# Patient Record
Sex: Female | Born: 1965 | Race: Black or African American | Hispanic: No | Smoking: Never smoker
Health system: Southern US, Community
[De-identification: ages and names within clinical notes are randomized; demographics above are authoritative.]

## PROBLEM LIST (undated history)

## (undated) DIAGNOSIS — R5382 Chronic fatigue, unspecified: Secondary | ICD-10-CM

## (undated) DIAGNOSIS — M797 Fibromyalgia: Secondary | ICD-10-CM

## (undated) HISTORY — DX: Chronic fatigue, unspecified: R53.82

## (undated) HISTORY — PX: ABDOMINAL HYSTERECTOMY: SHX81

## (undated) HISTORY — PX: OTHER SURGICAL HISTORY: SHX169

## (undated) HISTORY — DX: Fibromyalgia: M79.7

---

## 1999-03-18 ENCOUNTER — Other Ambulatory Visit: Admission: RE | Admit: 1999-03-18 | Discharge: 1999-03-18 | Payer: Self-pay | Admitting: Obstetrics and Gynecology

## 2000-03-07 ENCOUNTER — Other Ambulatory Visit: Admission: RE | Admit: 2000-03-07 | Discharge: 2000-03-07 | Payer: Self-pay | Admitting: Obstetrics and Gynecology

## 2000-10-16 ENCOUNTER — Inpatient Hospital Stay (HOSPITAL_COMMUNITY): Admission: AD | Admit: 2000-10-16 | Discharge: 2000-10-16 | Payer: Self-pay | Admitting: Obstetrics and Gynecology

## 2000-11-09 ENCOUNTER — Inpatient Hospital Stay (HOSPITAL_COMMUNITY): Admission: AD | Admit: 2000-11-09 | Discharge: 2000-11-09 | Payer: Self-pay | Admitting: Obstetrics and Gynecology

## 2000-11-09 ENCOUNTER — Inpatient Hospital Stay (HOSPITAL_COMMUNITY): Admission: AD | Admit: 2000-11-09 | Discharge: 2000-11-12 | Payer: Self-pay | Admitting: Obstetrics & Gynecology

## 2000-11-10 ENCOUNTER — Encounter: Payer: Self-pay | Admitting: Obstetrics & Gynecology

## 2000-12-19 HISTORY — PX: TUBAL LIGATION: SHX77

## 2001-02-17 ENCOUNTER — Inpatient Hospital Stay (HOSPITAL_COMMUNITY): Admission: AD | Admit: 2001-02-17 | Discharge: 2001-02-19 | Payer: Self-pay | Admitting: Obstetrics & Gynecology

## 2001-03-27 ENCOUNTER — Other Ambulatory Visit: Admission: RE | Admit: 2001-03-27 | Discharge: 2001-03-27 | Payer: Self-pay | Admitting: Obstetrics & Gynecology

## 2002-04-29 ENCOUNTER — Other Ambulatory Visit: Admission: RE | Admit: 2002-04-29 | Discharge: 2002-04-29 | Payer: Self-pay | Admitting: Obstetrics and Gynecology

## 2003-05-21 ENCOUNTER — Emergency Department (HOSPITAL_COMMUNITY): Admission: EM | Admit: 2003-05-21 | Discharge: 2003-05-21 | Payer: Self-pay | Admitting: Emergency Medicine

## 2006-07-10 ENCOUNTER — Emergency Department (HOSPITAL_COMMUNITY): Admission: EM | Admit: 2006-07-10 | Discharge: 2006-07-10 | Payer: Self-pay | Admitting: Emergency Medicine

## 2008-12-19 HISTORY — PX: OTHER SURGICAL HISTORY: SHX169

## 2009-09-18 ENCOUNTER — Ambulatory Visit (HOSPITAL_COMMUNITY): Admission: RE | Admit: 2009-09-18 | Discharge: 2009-09-18 | Payer: Self-pay | Admitting: Obstetrics and Gynecology

## 2009-09-18 ENCOUNTER — Encounter (INDEPENDENT_AMBULATORY_CARE_PROVIDER_SITE_OTHER): Payer: Self-pay | Admitting: Obstetrics and Gynecology

## 2009-10-01 ENCOUNTER — Encounter: Admission: RE | Admit: 2009-10-01 | Discharge: 2009-10-01 | Payer: Self-pay | Admitting: Obstetrics and Gynecology

## 2009-12-07 ENCOUNTER — Encounter: Payer: Self-pay | Admitting: Physician Assistant

## 2009-12-15 ENCOUNTER — Ambulatory Visit (HOSPITAL_COMMUNITY): Admission: RE | Admit: 2009-12-15 | Discharge: 2009-12-15 | Payer: Self-pay | Admitting: Obstetrics and Gynecology

## 2010-01-06 ENCOUNTER — Encounter (HOSPITAL_COMMUNITY): Admission: RE | Admit: 2010-01-06 | Discharge: 2010-02-05 | Payer: Self-pay | Admitting: Preventative Medicine

## 2010-01-15 ENCOUNTER — Encounter: Admission: RE | Admit: 2010-01-15 | Discharge: 2010-01-15 | Payer: Self-pay | Admitting: Obstetrics and Gynecology

## 2010-05-19 ENCOUNTER — Ambulatory Visit: Payer: Self-pay | Admitting: Family Medicine

## 2010-05-19 DIAGNOSIS — IMO0001 Reserved for inherently not codable concepts without codable children: Secondary | ICD-10-CM

## 2010-05-19 DIAGNOSIS — E559 Vitamin D deficiency, unspecified: Secondary | ICD-10-CM | POA: Insufficient documentation

## 2010-05-19 DIAGNOSIS — R5381 Other malaise: Secondary | ICD-10-CM

## 2010-05-19 DIAGNOSIS — F341 Dysthymic disorder: Secondary | ICD-10-CM | POA: Insufficient documentation

## 2010-05-19 DIAGNOSIS — R5383 Other fatigue: Secondary | ICD-10-CM

## 2010-05-19 DIAGNOSIS — G4700 Insomnia, unspecified: Secondary | ICD-10-CM

## 2010-05-21 ENCOUNTER — Encounter: Payer: Self-pay | Admitting: Physician Assistant

## 2010-05-21 LAB — CONVERTED CEMR LAB: Retic Ct Pct: 0.9 % (ref 0.4–3.1)

## 2010-05-24 ENCOUNTER — Encounter: Payer: Self-pay | Admitting: Physician Assistant

## 2010-06-07 ENCOUNTER — Encounter (INDEPENDENT_AMBULATORY_CARE_PROVIDER_SITE_OTHER): Payer: Self-pay | Admitting: Obstetrics and Gynecology

## 2010-06-07 ENCOUNTER — Ambulatory Visit (HOSPITAL_COMMUNITY): Admission: RE | Admit: 2010-06-07 | Discharge: 2010-06-08 | Payer: Self-pay | Admitting: Obstetrics and Gynecology

## 2010-06-17 ENCOUNTER — Encounter: Payer: Self-pay | Admitting: Physician Assistant

## 2010-06-22 ENCOUNTER — Encounter: Payer: Self-pay | Admitting: Physician Assistant

## 2010-06-30 ENCOUNTER — Encounter: Payer: Self-pay | Admitting: Physician Assistant

## 2010-07-28 ENCOUNTER — Ambulatory Visit: Payer: Self-pay | Admitting: Family Medicine

## 2010-08-04 ENCOUNTER — Encounter: Payer: Self-pay | Admitting: Physician Assistant

## 2010-08-26 ENCOUNTER — Encounter: Payer: Self-pay | Admitting: Physician Assistant

## 2011-01-20 NOTE — Assessment & Plan Note (Signed)
Summary: hospital follow up- room 1   Vital Signs:  Patient profile:   45 year old female Height:      65.5 inches Weight:      155 pounds BMI:     25.49 O2 Sat:      98 % on Room air Pulse rate:   78 / minute Resp:     16 per minute BP sitting:   102 / 60  (left arm)  Vitals Entered By: Adella Hare LPN (July 28, 2010 9:47 AM) CC: follow up- stomach pain   CC:  follow up- stomach pain.  History of Present Illness: Pt presents today with c/o abd pain and bloating.  Describes the pain as crampy spasms. She had a laprascopic partial hyst in June. Was admitted the end of June due to abd pain and bloody stools.  Dx with colitis.  Treated with antibiotic.  Has seen GI outpt and has colonoscopy scheduled in Sept.  She continues to have abd pain and bloating.  Stools are formed but small and frequent.  No blood or melena seen since dischg. Some days are worse with pain than others. No nausea or vomiting.  Is only eating spinach, baked chicken, broth and clear fluids.   Pt states she is urinating normally.  No dysuria or freqency.  Pt is only taking Ambien.  Cymbalta was put on hold.  Pt states she seems to be doing well with her depression at this time without it.  She is uncertain if this is because she is so focused on her abd pain that she hasnt paid attn to it though.  Pt brought her recent hospitalization records.  Reviewed consult, H&P, Dischg summary and diagnostic studies.   Allergies (verified): 1)  ! Pcn 2)  ! Sulfa  Past History:  Past medical history reviewed for relevance to current acute and chronic problems.  Past Medical History: Reviewed history from 05/19/2010 and no changes required. Tension headaches Fibromyalgia  Review of Systems General:  Denies chills, fever, and loss of appetite. GI:  Complains of abdominal pain, change in bowel habits, and loss of appetite; denies bloody stools, constipation, dark tarry stools, diarrhea, indigestion, nausea, and  vomiting. GU:  Denies dysuria and urinary frequency. Psych:  Denies anxiety, depression, suicidal thoughts/plans, and thoughts /plans of harming others.  Physical Exam  General:  Well-developed,well-nourished,in no acute distress; alert,appropriate and cooperative throughout examination Head:  Normocephalic and atraumatic without obvious abnormalities. No apparent alopecia or balding. Lungs:  Normal respiratory effort, chest expands symmetrically. Lungs are clear to auscultation, no crackles or wheezes. Heart:  Normal rate and regular rhythm. S1 and S2 normal without gallop, murmur, click, rub or other extra sounds. Abdomen:  soft, normal bowel sounds, no masses, no hepatomegaly, and no splenomegaly.  Diffuse tenderness to palp, increased in bilat LQ.  No guarding, or rigidity. Psych:  memory intact for recent and remote, normally interactive, good eye contact, and tearful.     Impression & Recommendations:  Problem # 1:  ABDOMINAL PAIN (ICD-789.00) Assessment New Pt will continue f/u with GI. Rx Bentyl to help with abd cramping and spasms.  Problem # 2:  DEPRESSION/ANXIETY (ICD-300.4) Assessment: Comment Only Not currently taking Cymbalta since hosp dischg.  Will re-eval depression at f/u visit.  Complete Medication List: 1)  Cymbalta 30 Mg Cpep (Duloxetine hcl) .... Take 1 daily 2)  Zolpidem Tartrate 10 Mg Tabs (Zolpidem tartrate) .... Take 1 at bedtime as needed for insomnia 3)  Dicyclomine Hcl 20 Mg  Tabs (Dicyclomine hcl) .... Take 1 tab every 6 -8 hrs as needed for abd pain  Patient Instructions: 1)  Please schedule a follow-up appointment in 1 month. 2)  I have ordered a medication for you to use for your abd pain as needed. 3)  Continue with the low residue diet, and follow up with GI as planned. 4)  Continue to hold your Cymbalta at this time.  We will discuss this again at your follow up appt in one month to determine if you may need to restart  this. Prescriptions: DICYCLOMINE HCL 20 MG TABS (DICYCLOMINE HCL) take 1 tab every 6 -8 hrs as needed for abd pain  #40 x 1   Entered and Authorized by:   Esperanza Sheets PA   Signed by:   Esperanza Sheets PA on 07/28/2010   Method used:   Electronically to        [facility]  [address] (retail)       [address]       [place], [place]  [address]       Ph: [phone]       Fax: [phone]   RxID:   [id number]

## 2011-01-20 NOTE — Consult Note (Signed)
Summary: Regina Lamb   Imported By: Lind Guest 07/30/2010 08:22:13  _____________________________________________________________________  External Attachment:    Type:   Image     Comment:   External Document

## 2011-01-20 NOTE — Procedures (Signed)
Summary: Gastroenterology  Gastroenterology   Imported By: Lind Guest 08/10/2010 08:14:49  _____________________________________________________________________  External Attachment:    Type:   Image     Comment:   External Document

## 2011-01-20 NOTE — Progress Notes (Signed)
Summary: dr.[email or web address]  dr.[email or web address]   Imported By: Lind Guest 06/01/2010 12:49:28  _____________________________________________________________________  External Attachment:    Type:   Image     Comment:   External Document

## 2011-01-20 NOTE — Letter (Signed)
Summary: Discharge Summary  Discharge Summary   Imported By: Lind Guest 07/30/2010 08:21:33  _____________________________________________________________________  External Attachment:    Type:   Image     Comment:   External Document

## 2011-01-20 NOTE — Assessment & Plan Note (Signed)
Summary: new patient- room 1   Vital Signs:  Patient profile:   45 year old female Height:      65.5 inches Weight:      160.75 pounds BMI:     26.44 O2 Sat:      99 % on Room air Pulse rate:   51 / minute Resp:     16 per minute BP sitting:   100 / 64  (left arm)  Vitals Entered By: Adella Hare LPN (May 20, 2439 3:23 PM) CC: new patient Is Patient Diabetic? No Pain Assessment Patient in pain? no        CC:  new patient.  History of Present Illness: New pt here to establish care with new PCP.  Pt is here with a complicated medical hx over the last yr. To summarize, she had surgery for fibroids.  Since then has developed a calcified mass in her Lt Pelvis.  She is scheduled June 20 th for removal and hyst. She has been having extreme fatigue, weakness, and body aches since last fall. She has been to Neurologist and had full evaluation.  He told her that he thought it was fibromyalgia. she has since seen Dr Dimas Aguas for Fibromyalgia, but doesn't feel like he is the right dr to be treating her for her syptoms. Pt has been out of work for the last 4-5 weeks due to her syptoms.  Currently plans to be out until after her pelvic surgery.   Current Medications (verified): 1)  Imipramine Hcl 25 Mg Tabs (Imipramine Hcl) .... One Tab By Mouth At Bedtime 2)  Solodyn 65 Mg Xr24h-Tab (Minocycline Hcl) .... One Tab By Mouth Once Daily With Food 3)  Meclizine Hcl 12.5 Mg Tabs (Meclizine Hcl) .... One Tab By Mouth Once Daily As Needed  Allergies (verified): 1)  ! Pcn 2)  ! Sulfa  Past History:  Past medical, surgical, family and social histories (including risk factors) reviewed for relevance to current acute and chronic problems.  Past Medical History: Tension headaches Fibromyalgia  Past Surgical History: Fibroid tumors removed 2010 Laproscopy- endometriosis Tubal ligation 2002  Family History: Reviewed history and no changes required. mother living- DM, HTN,  Hyperlipidemia, hx breast cancer, rheumatoid arthritis, CHF father living- HTN, cataracts, dementia, hyperlipidemia Two sisters living- Depression, migraines, HTN Two brothers living- heart disease, obesity, arthritis, HTN, anxiety One brother deceased- days after birth  Social History: Reviewed history and no changes required. Employed full time- Engineer, mining- therapist Divorced One child  Never Smoked Alcohol use-no Drug use-no Smoking Status:  never Drug Use:  no  Review of Systems General:  Complains of chills and fatigue; denies fever. Eyes:  Complains of blurring; denies double vision. ENT:  Denies earache, nasal congestion, and sore throat. CV:  Denies chest pain or discomfort and palpitations. Resp:  Complains of shortness of breath; denies cough. GI:  Denies abdominal pain, constipation, diarrhea, indigestion, loss of appetite, nausea, and vomiting. MS:  Complains of joint pain and muscle aches. Neuro:  Complains of numbness and tingling; FINGERS AND TOES. FEELS LIKE LIGHTENING.Marland Kitchen Psych:  Complains of anxiety and depression; SEEING THERAPIST. DISTURBED SLEEP. Marland Kitchen  Physical Exam  General:  Well-developed,well-nourished,in no acute distress; alert,appropriate and cooperative throughout examination Head:  Normocephalic and atraumatic without obvious abnormalities. No apparent alopecia or balding. Eyes:  pupils equal, pupils round, and pupils reactive to light.   Ears:  External ear exam shows no significant lesions or deformities.  Otoscopic examination reveals clear canals, tympanic membranes  are intact bilaterally without bulging, retraction, inflammation or discharge. Hearing is grossly normal bilaterally. Nose:  External nasal examination shows no deformity or inflammation. Nasal mucosa are pink and moist without lesions or exudates. Mouth:  Oral mucosa and oropharynx without lesions or exudates.  Teeth in good repair. Neck:  No deformities, masses, or tenderness  noted. Lungs:  Normal respiratory effort, chest expands symmetrically. Lungs are clear to auscultation, no crackles or wheezes. Heart:  Normal rate and regular rhythm. S1 and S2 normal without gallop, murmur, click, rub or other extra sounds. Abdomen:  Bowel sounds positive,abdomen soft and non-tender without masses, organomegaly or hernias noted. Msk:  Pt reports TTP of UE, LE, and thorax.  All 4 quads of body is painful to palpation. Pulses:  R radial normal, R dorsalis pedis normal, L radial normal, and L dorsalis pedis normal.   Extremities:  No clubbing, cyanosis, edema, or deformity noted with normal full range of motion of all joints.   Neurologic:  alert & oriented X3, strength normal in all extremities, gait normal, and DTRs symmetrical and normal.   Skin:  Intact without suspicious lesions or rashes Cervical Nodes:  No lymphadenopathy noted Psych:  Cognition and judgment appear intact. Alert and cooperative with normal attention span and concentration. 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  Will f/u with GYN regarding this.  Complete Medication List: 1)  Solodyn 65 Mg Xr24h-tab (Minocycline hcl) .... One tab by mouth once daily with food 2)  Meclizine Hcl 12.5 Mg Tabs (Meclizine hcl) .... One tab by mouth once daily as needed 3)  Cymbalta 30 Mg Cpep (Duloxetine hcl) .... Take 1 daily 4)  Zolpidem Tartrate 10 Mg Tabs (Zolpidem tartrate) .... Take 1 at bedtime as needed for  insomnia  Other Orders: T-CBC No Diff ([id number]) T-TSH ([id number])  Patient Instructions: 1)  Please schedule a follow-up appointment in 2 months. 2)  I have referred you to a rheumatologist. 3)  I have ordered blood work. 4)  I have prescribed Cymbalta 30 mg to take for depression and fibromyalgia. 5)  I have prescribed Ambien to use for sleep. Prescriptions: ZOLPIDEM TARTRATE 10 MG TABS (ZOLPIDEM TARTRATE) take 1 at bedtime as needed for insomnia  #30 x 0   Entered and Authorized by:   Esperanza Sheets PA   Signed by:   Esperanza Sheets PA on 05/19/2010   Method used:   Printed then faxed to ...       [facility]  [address] (retail)       [address]       [place], [place]  [address]       Ph: [phone]       Fax: [phone]   RxID:   [id number] CYMBALTA 30 MG CPEP (DULOXETINE HCL) take 1 daily  #30 x 1   Entered and Authorized by:   Esperanza Sheets PA   Signed by:   Esperanza Sheets PA on 05/19/2010   Method used:   Electronically to        [facility]   [address] [facility] (  retail)       [address]       [place], [place]  [address]       Ph: [phone]       Fax: [phone]   RxID:   [id number]

## 2011-03-06 LAB — HCG, SERUM, QUALITATIVE: Preg, Serum: NEGATIVE

## 2011-03-06 LAB — CBC
MCHC: 33.4 g/dL (ref 30.0–36.0)
Platelets: 277 10*3/uL (ref 150–400)
RBC: 4.18 MIL/uL (ref 3.87–5.11)
RDW: 13.2 % (ref 11.5–15.5)
WBC: 3.8 10*3/uL — ABNORMAL LOW (ref 4.0–10.5)

## 2011-03-09 ENCOUNTER — Telehealth: Payer: Self-pay | Admitting: Family Medicine

## 2011-03-09 NOTE — Telephone Encounter (Signed)
Unfortunately advise no appts available at this time , will need alternate urgwent  Care or the ED since she reports being extremely sick

## 2011-03-15 ENCOUNTER — Ambulatory Visit: Payer: Self-pay

## 2011-03-24 LAB — CBC
Hemoglobin: 12 g/dL (ref 12.0–15.0)
RBC: 4.29 MIL/uL (ref 3.87–5.11)
WBC: 3.5 10*3/uL — ABNORMAL LOW (ref 4.0–10.5)

## 2011-03-24 LAB — HCG, SERUM, QUALITATIVE: Preg, Serum: NEGATIVE

## 2011-03-30 ENCOUNTER — Encounter: Payer: Self-pay | Admitting: Physician Assistant

## 2011-03-30 ENCOUNTER — Telehealth: Payer: Self-pay | Admitting: Physician Assistant

## 2011-03-30 NOTE — Telephone Encounter (Signed)
Appt scheduled for Friday. Had labs done at [facility] and her hba1c was elevated

## 2011-03-30 NOTE — Telephone Encounter (Signed)
Patient coming on Friday  4.13.12  lhb

## 2011-03-31 ENCOUNTER — Encounter: Payer: Self-pay | Admitting: Family Medicine

## 2011-03-31 ENCOUNTER — Encounter: Payer: Self-pay | Admitting: Physician Assistant

## 2011-04-04 ENCOUNTER — Encounter: Payer: Self-pay | Admitting: Family Medicine

## 2011-04-11 ENCOUNTER — Encounter: Payer: Self-pay | Admitting: Family Medicine

## 2011-04-13 ENCOUNTER — Encounter: Payer: Self-pay | Admitting: Physician Assistant

## 2011-04-14 ENCOUNTER — Encounter: Payer: Self-pay | Admitting: Family Medicine

## 2011-04-14 ENCOUNTER — Ambulatory Visit (INDEPENDENT_AMBULATORY_CARE_PROVIDER_SITE_OTHER): Payer: 59 | Admitting: Family Medicine

## 2011-04-14 ENCOUNTER — Other Ambulatory Visit: Payer: Self-pay | Admitting: Family Medicine

## 2011-04-14 VITALS — BP 110/70 | HR 74 | Resp 16 | Ht 65.0 in | Wt 156.0 lb

## 2011-04-14 DIAGNOSIS — Z139 Encounter for screening, unspecified: Secondary | ICD-10-CM

## 2011-04-14 DIAGNOSIS — E559 Vitamin D deficiency, unspecified: Secondary | ICD-10-CM

## 2011-04-14 DIAGNOSIS — G4700 Insomnia, unspecified: Secondary | ICD-10-CM

## 2011-04-14 DIAGNOSIS — R5383 Other fatigue: Secondary | ICD-10-CM

## 2011-04-14 DIAGNOSIS — Z23 Encounter for immunization: Secondary | ICD-10-CM

## 2011-04-14 DIAGNOSIS — M949 Disorder of cartilage, unspecified: Secondary | ICD-10-CM

## 2011-04-14 DIAGNOSIS — R7301 Impaired fasting glucose: Secondary | ICD-10-CM

## 2011-04-14 DIAGNOSIS — Z79899 Other long term (current) drug therapy: Secondary | ICD-10-CM

## 2011-04-14 DIAGNOSIS — F341 Dysthymic disorder: Secondary | ICD-10-CM

## 2011-04-14 NOTE — Patient Instructions (Addendum)
F/u in 3 month  CBC and anemia panel, hBA1C and fasting lipids ESR in 3 months. Pls start  Taking one multivitamin one daily Pls start  Walking 30 minutes every day.  We will provide 1200 and 1500 cal diet sheet. It is vital you eat regularly. A healthy diet is rich in fruit, vegetables and whole grains. Poultry fish, nuts and beans are a healthy choice for protein rather then red meat. A low sodium diet and drinking 64 ounces of water daily is generally recommended. Oils and sweet should be limited. Carbohydrates especially for those who are diabetic or overweight, should be limited to 34-45 gram per meal. It is important to eat on a regular schedule, at least 3 times daily. Snacks should be primarily fruits, vegetables or nuts.   You will be referred for a sleep study.  You need to change your sleep habits, commit to 7 hrs per night , max of 13hrs work per day, ok to use benadryl 1 at night for allergies and sleep

## 2011-04-14 NOTE — Progress Notes (Signed)
  Subjective:    Patient ID: Regina Lamb, female    DOB: [birth year], 45 y.o.   MRN: [id number]  HPI  C/o exhaustion , denies excessive daytime sleepiness, or snoring, but states she simply has no energy. The pt gives a h/o inadequate hours of sleep, and constantly "being on the run".Shedenies symptoms of depression but does feel stressed. She denies fever , chills , poor apetite or change in bowel movements. She was asked to be seen because of "abnormal blood results" question of elevated blood sugar and anemia She denies excessive daytime sleepiness and is not known to snore excessively  Review of Systems SeeHPI Denies recent fever or chills. Denies sinus pressure, nasal congestion, ear pain or sore throat. Denies chest congestion, productive cough or wheezing. Denies chest pains, palpitations, paroxysmal nocturnal dyspnea, orthopnea and leg swelling Denies abdominal pain, nausea, vomiting,diarrhea or constipation.  Denies rectal bleeding or change in bowel movement. Denies dysuria, frequency, hesitancy or incontinence. Denies joint pain, swelling and limitation in mobility. Denies headaches, seizure, numbness, or tingling.  Denies skin break down or rash.        Objective:   Physical Exam Patient alert and oriented and in no Cardiopulmonary distress.  HEENT: No facial asymmetry, EOMI, no sinus tenderness, TM's clear, Oropharynx pink and moist.  Neck supple no adenopathy.  Chest: Clear to auscultation bilaterally.  CVS: S1, S2 no murmurs, no S3.  ABD: Soft non tender. Bowel sounds normal.  Ext: No edema  MS: Adequate ROM spine, shoulders, hips and knees.  Skin: Intact, no ulcerations or rash noted.  Psych: Good eye contact, normal affect. Memory intact not anxious or depressed appearing.  CNS: CN 2-12 intact, power, tone and sensation normal throughout.        Assessment & Plan:

## 2011-04-18 ENCOUNTER — Encounter: Payer: Self-pay | Admitting: Family Medicine

## 2011-04-18 ENCOUNTER — Encounter: Payer: Self-pay | Admitting: Physician Assistant

## 2011-04-19 ENCOUNTER — Ambulatory Visit (HOSPITAL_COMMUNITY)
Admission: RE | Admit: 2011-04-19 | Discharge: 2011-04-19 | Disposition: A | Discharge status: Home / Self Care | Payer: 59 | Source: Ambulatory Visit | Attending: Family Medicine | Admitting: Family Medicine

## 2011-04-19 DIAGNOSIS — Z1231 Encounter for screening mammogram for malignant neoplasm of breast: Secondary | ICD-10-CM | POA: Insufficient documentation

## 2011-04-19 DIAGNOSIS — Z139 Encounter for screening, unspecified: Secondary | ICD-10-CM

## 2011-04-28 ENCOUNTER — Encounter: Payer: Self-pay | Admitting: Family Medicine

## 2011-04-28 NOTE — Assessment & Plan Note (Signed)
Deteriorated, will refer to sleep specialist

## 2011-04-28 NOTE — Assessment & Plan Note (Signed)
Labs show decreased vit D levels weekly supplement prescribed

## 2011-04-28 NOTE — Assessment & Plan Note (Signed)
Pt denies symptoms of either at this time, had been on cymbalta in the past, does not see the need. Denies suicidal or homicidal ideation

## 2011-04-28 NOTE — Assessment & Plan Note (Signed)
Requests sleep aid to improve quality and duration of sleepGood. Sleep hygiene reviewed in detail also

## 2011-05-06 NOTE — Discharge Summary (Signed)
[facility]  Patient:    Regina Lamb, Regina Lamb                 MRN: [id number] Adm. Date:  [id number] Disc. Date: [id number] Attending:  Mickle Mallory Dictator:   Leilani Able, P.A.                           Discharge Summary  FINAL DIAGNOSES:              A 27-week gestation with preterm                               contractions.  HISTORY OF PRESENT ILLNESS:   This 45 year old G1, P0 presents at around [redacted] weeks gestation with preterm contractions. Patient has been treated with subcutaneous and p.o. Terbutaline. She came back today complaining of increased pain. Her cervix was still long and closed upon admission.  HOSPITAL COURSE:              She was started on magnesium sulfate, clindamycin IV, and labs were to be obtained. Patients pain was localized to the area near the umbilicus and a 5- x 5-cm fibroid was palpated. There were no hernias or contractions noted. No evidence of any infectious etiology. Patient was continued on her magnesium sulfate at this point.  By hospital day #1 patient was doing well with just an occasional episode of the pain around where her fibroid was located. Patient was stopped on her magnesium sulfate and started on oral Terbutaline. Clindamycin was stopped at this point.  By November 25 patient was doing well without complaints. Tylox was controlling her pain. She was not having any contractions on her Terbutaline.  DISCHARGE INSTRUCTIONS:       She was sent home on a regular diet. She was told to decrease activities and told to continue her bed rest. Patient was also told to call with any increased pain, fever, or contractions.  DISCHARGE MEDICATIONS:        She was given Terbutaline 5 mg one every four hours and also some Tylox for her pain. DD:  12/11/00 TD:  12/11/00 Job: [id number]

## 2011-07-09 LAB — LIPID PANEL
Cholesterol: 211 mg/dL — ABNORMAL HIGH (ref 0–200)
HDL: 64 mg/dL (ref >39)

## 2011-07-09 LAB — CBC WITH DIFFERENTIAL/PLATELET
Basophils Absolute: 0 10*3/uL (ref 0.0–0.1)
HCT: 38.7 % (ref 36.0–46.0)
Hemoglobin: 12.3 g/dL (ref 12.0–15.0)
Lymphocytes Relative: 52 % — ABNORMAL HIGH (ref 12–46)
Lymphs Abs: 2.1 10*3/uL (ref 0.7–4.0)
Monocytes Absolute: 0.3 10*3/uL (ref 0.1–1.0)
Monocytes Relative: 7 % (ref 3–12)
Neutro Abs: 1.6 10*3/uL — ABNORMAL LOW (ref 1.7–7.7)
RBC: 4.49 MIL/uL (ref 3.87–5.11)
WBC: 4 10*3/uL (ref 4.0–10.5)

## 2011-07-09 LAB — IRON AND TIBC
TIBC: 342 ug/dL (ref 250–470)
UIBC: 300 ug/dL

## 2011-07-09 LAB — SEDIMENTATION RATE: Sed Rate: 11 mm/hr (ref 0–22)

## 2011-07-09 LAB — VITAMIN B12: Vitamin B-12: 433 pg/mL (ref 211–911)

## 2011-07-09 LAB — HEMOGLOBIN A1C
Hgb A1c MFr Bld: 5.7 % — ABNORMAL HIGH (ref <5.7)
Mean Plasma Glucose: 117 mg/dL — ABNORMAL HIGH (ref <117)

## 2011-07-12 ENCOUNTER — Encounter: Payer: Self-pay | Admitting: Physician Assistant

## 2011-07-14 ENCOUNTER — Encounter: Payer: Self-pay | Admitting: Physician Assistant

## 2011-07-14 ENCOUNTER — Ambulatory Visit: Payer: 59 | Admitting: Family Medicine

## 2011-07-29 ENCOUNTER — Encounter: Payer: Self-pay | Admitting: Physician Assistant

## 2011-08-01 ENCOUNTER — Encounter: Payer: Self-pay | Admitting: Physician Assistant

## 2011-08-02 ENCOUNTER — Encounter: Payer: Self-pay | Admitting: Family Medicine

## 2011-08-02 ENCOUNTER — Ambulatory Visit (INDEPENDENT_AMBULATORY_CARE_PROVIDER_SITE_OTHER): Payer: 59 | Admitting: Family Medicine

## 2011-08-02 VITALS — BP 100/62 | HR 87 | Resp 14 | Ht 65.0 in | Wt 152.0 lb

## 2011-08-02 DIAGNOSIS — G4700 Insomnia, unspecified: Secondary | ICD-10-CM

## 2011-08-02 DIAGNOSIS — R51 Headache: Secondary | ICD-10-CM | POA: Insufficient documentation

## 2011-08-02 DIAGNOSIS — R519 Headache, unspecified: Secondary | ICD-10-CM | POA: Insufficient documentation

## 2011-08-02 DIAGNOSIS — E559 Vitamin D deficiency, unspecified: Secondary | ICD-10-CM

## 2011-08-02 MED ORDER — IBUPROFEN 600 MG PO TABS: ORAL_TABLET | ORAL | Status: DC

## 2011-08-02 MED ORDER — BUTALBITAL-APAP-CAFFEINE 50-325-40 MG PO TABS: ORAL_TABLET | ORAL | Status: DC

## 2011-08-02 MED ORDER — TOPIRAMATE 25 MG PO TABS: 25.0000 mg | ORAL_TABLET | Freq: Every day | ORAL | Status: DC

## 2011-08-02 NOTE — Progress Notes (Signed)
  Subjective:    Patient ID: Regina Lamb, female    DOB: 1966-01-08, 45 y.o.   MRN: [id number]  HPI Pt missed her sleep study and will reschedule  She had concussion , fell in bathtub 09/2009, since  Then she started having headaches. She has seen neurologist in the past , but is having increased frequency and severity in her headaches in the past 9 months.On avg she has a headache 2 weeks out of a month. 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 Denies sinus pressure, nasal congestion, ear pain or sore throat. Denies chest congestion, productive cough or wheezing. Denies chest pains, palpitations and leg swelling Denies abdominal pain, nausea, vomiting,diarrhea or constipation.   Denies dysuria, frequency, hesitancy or incontinence. Denies joint pain, swelling and limitation in mobility.  Denies skin break down or rash.        Objective:   Physical Exam Patient alert and oriented and in no cardiopulmonary distress.  HEENT: No facial asymmetry, EOMI, no sinus tenderness,  oropharynx pink and moist.  Neck supple no adenopathy.  Chest: Clear to auscultation bilaterally.  CVS: S1, S2 no murmurs, no S3.  ABD: Soft non tender. Bowel sounds normal.  Ext: No edema  MS: Adequate ROM spine, shoulders, hips and knees.  Skin: Intact, no ulcerations or rash noted.  Psych: Good eye contact, normal affect. Memory intact not anxious or depressed appearing.  CNS: CN 2-12 intact, power, tone and sensation normal throughout.        Assessment & Plan:

## 2011-08-02 NOTE — Patient Instructions (Signed)
F/u in  Late November or early December Pls practice good sleep hygiene   Medication is sent in for daily use for migraine prevention, and use also for use when you have a bad headache.  You are being referred to Dr Gerilyn Pilgrim for headache.  Continue once daily iron, add one multivitamin once daily . Pls ensure the vitamin has at least 400 to 600IU  Of vit D   Pls reduce fried and fatty foods, cheese and oils.  It is important that you exercise regularly at least 30 minutes 5 times a week. If you develop chest pain, have severe difficulty breathing, or feel very tired, stop exercising immediately and seek medical attention    A healthy diet is rich in fruit, vegetables and whole grains. Poultry fish, nuts and beans are a healthy choice for protein rather then red meat. A low sodium diet and drinking 64 ounces of water daily is generally recommended. Oils and sweet should be limited. Carbohydrates especially for those who are diabetic or overweight, should be limited to 34-45 gram per meal. It is important to eat on a regular schedule, at least 3 times daily. Snacks should be primarily fruits, vegetables or nuts. . Goal  of 6 hrs every day in bed with no light or sound...SLEEPING

## 2011-08-03 ENCOUNTER — Emergency Department (HOSPITAL_COMMUNITY): Payer: 59

## 2011-08-03 ENCOUNTER — Telehealth: Payer: Self-pay | Admitting: Physician Assistant

## 2011-08-03 ENCOUNTER — Emergency Department (HOSPITAL_COMMUNITY)
Admission: EM | Admit: 2011-08-03 | Discharge: 2011-08-03 | Disposition: A | Discharge status: Home / Self Care | Payer: 59 | Attending: Emergency Medicine | Admitting: Emergency Medicine

## 2011-08-03 ENCOUNTER — Inpatient Hospital Stay (EMERGENCY_DEPARTMENT_HOSPITAL)
Admission: AD | Admit: 2011-08-03 | Discharge: 2011-08-03 | Disposition: A | Discharge status: Home / Self Care | Payer: 59 | Source: Ambulatory Visit | Attending: Obstetrics and Gynecology | Admitting: Obstetrics and Gynecology

## 2011-08-03 DIAGNOSIS — R209 Unspecified disturbances of skin sensation: Secondary | ICD-10-CM | POA: Insufficient documentation

## 2011-08-03 DIAGNOSIS — M25539 Pain in unspecified wrist: Secondary | ICD-10-CM | POA: Insufficient documentation

## 2011-08-03 DIAGNOSIS — W1809XA Striking against other object with subsequent fall, initial encounter: Secondary | ICD-10-CM | POA: Insufficient documentation

## 2011-08-03 DIAGNOSIS — S59909A Unspecified injury of unspecified elbow, initial encounter: Secondary | ICD-10-CM | POA: Insufficient documentation

## 2011-08-03 DIAGNOSIS — S59919A Unspecified injury of unspecified forearm, initial encounter: Secondary | ICD-10-CM

## 2011-08-03 DIAGNOSIS — W010XXA Fall on same level from slipping, tripping and stumbling without subsequent striking against object, initial encounter: Secondary | ICD-10-CM | POA: Insufficient documentation

## 2011-08-03 DIAGNOSIS — S6990XA Unspecified injury of unspecified wrist, hand and finger(s), initial encounter: Secondary | ICD-10-CM | POA: Insufficient documentation

## 2011-08-03 NOTE — Progress Notes (Signed)
Pt states she fell last night 8-14 at 2000 and hurt her L wrist. Has been having increasing pain, swelling in her L wrist radiating up into L elbow.

## 2011-08-03 NOTE — Progress Notes (Signed)
Ice pack applied in triage.

## 2011-08-03 NOTE — ED Provider Notes (Signed)
History     Chief Complaint  Patient presents with  . Wrist Pain   HPI Larey Seat at work today landing on Left Hand and arm.  Has sharp pain in hand, wrist and arm with numbness in 4th and 5th fingers.     Past Medical History  Diagnosis Date  . Vitamin D deficiency   . Fibromyalgia   . Fibromyalgia   . Chronic fatigue     Past Surgical History  Procedure Date  . Fibroid tumors removed 2010  . Laproscopy -endometriosis   . Tubal ligation 2002  . Abdominal hysterectomy     Family History  Problem Relation Age of Onset  . Diabetes Mother   . Hypertension Mother   . Hyperlipidemia Mother   . Rheum arthritis Mother   . Heart failure Mother   . Breast cancer Mother   . Cancer Mother     breast  . Dementia Father   . Cataracts Father   . Hyperlipidemia Father   . Hypertension Father   . Heart disease Brother   . Cancer Maternal Grandmother     lung  . Depression Sister     History  Substance Use Topics  . Smoking status: Never Smoker   . Smokeless tobacco: Not on file  . Alcohol Use: No    Allergies:  Allergies  Allergen Reactions  . Penicillins   . Sulfonamide Derivatives     Prescriptions prior to admission  Medication Sig Dispense Refill  . butalbital-acetaminophen-caffeine (FIORICET) 50-325-40 MG per tablet One tablet twice daily as needed for severe headache  30 tablet  0  . ferrous gluconate (FERGON) 325 MG tablet Take 325 mg by mouth daily with breakfast.        . ibuprofen (ADVIL,MOTRIN) 600 MG tablet One tablet twice daily as needed  For severe headache  40 tablet  0  . topiramate (TOPAMAX) 25 MG tablet Take 1 tablet (25 mg total) by mouth at bedtime.  30 tablet  2  . zolpidem (AMBIEN) 10 MG tablet Take 10 mg by mouth at bedtime as needed. As needed for insomnia           ROS Negative except as above  Physical Exam   Blood pressure 100/67, pulse 64, temperature 98.9 F (37.2 C), temperature source Oral, resp. rate 16, height 5' 3.5" (1.613  m), weight 152 lb 5.8 oz (69.11 kg), SpO2 99.00%.  Physical Exam Left Wrist tender. No edema or erethema. No ecchymosis. Limited ROM due to pain. Some point tenderness on lateral wrist condyl.    MAU Course  Procedures    Assessment and Plan  Explained to patient that we do not have the expertise here to adequately assess her orthopedically.  Offered to have her go to Urgent Care or ED. Wants to go to [facility] by private car.   [facility] 08/03/2011, 6:19 PM

## 2011-08-03 NOTE — Telephone Encounter (Signed)
Faxed over a note stating she was seen at 2:45 yesterday

## 2011-08-04 ENCOUNTER — Ambulatory Visit: Payer: 59 | Attending: Neurology | Admitting: Sleep Medicine

## 2011-08-04 DIAGNOSIS — G4730 Sleep apnea, unspecified: Secondary | ICD-10-CM

## 2011-08-04 DIAGNOSIS — G4710 Hypersomnia, unspecified: Secondary | ICD-10-CM | POA: Insufficient documentation

## 2011-08-05 NOTE — Procedures (Signed)
NAME:  Regina Lamb, PROBERT                ACCOUNT NO.:  [id number]  MEDICAL RECORD NO.:  [id number]          PATIENT TYPE:  OUT  LOCATION:  SLEEP LAB                     FACILITY:  [facility]  PHYSICIAN:  Azani Brogdon A. Gerilyn Pilgrim, M.D. DATE OF BIRTH:  1966-02-26  DATE OF STUDY:  08/04/2011                           NOCTURNAL POLYSOMNOGRAM  REFERRING PHYSICIAN:  Esperanza Sheets, PA-C  REFERRING PHYSICIAN:  Keagon Glascoe A. Gerilyn Pilgrim, MD  INDICATION:  A 45 year old who presents with snoring and daytime fatigue.  This study is being done to evaluate for obstructive sleep apnea syndrome.   MEDICATIONS:  Tylenol.  EPWORTH SLEEPINESS SCALE:  0..  BMI:  25.Marland Kitchen  ARCHITECTURAL SUMMARY:  The total recording time is 381 minutes.  Sleep efficiency is 88%, sleep latency 14 minutes, REM latency 90 minutes. Stage N1 1%, N2 57%, N3 24%, and REM sleep 80%.  RESPIRATORY SUMMARY:  Baseline oxygen saturation is 99, lowest saturation 94 during non-REM sleep.  Diagnostic AHI 0.  RDI is also 0.  LIMB MOVEMENT SUMMARY:  PLM index 0.  ELECTROCARDIOGRAM SUMMARY:  Average heart rate 54 with no significant dysrhythmias observed.  IMPRESSION:  Unremarkable nocturnal polysomnography.    Hassaan Crite A. Gerilyn Pilgrim, M.D.    KAD/MEDQ  D:  08/05/2011 09:18:40  T:  08/05/2011 11:08:47  Job:  [id number]

## 2011-08-15 NOTE — Assessment & Plan Note (Signed)
Uncontrolled, pt to start prophylactic topamax, and use fioricet as needed

## 2011-08-15 NOTE — Assessment & Plan Note (Signed)
Chronic problem, averages about 4 hrs sleep, hygiene discussed, changes to be made, med to be started

## 2011-08-15 NOTE — Assessment & Plan Note (Signed)
Pt to start weekly prescription vit D

## 2011-11-16 ENCOUNTER — Encounter: Payer: Self-pay | Admitting: Physician Assistant

## 2011-11-21 ENCOUNTER — Ambulatory Visit (INDEPENDENT_AMBULATORY_CARE_PROVIDER_SITE_OTHER): Payer: BC Managed Care – PPO | Admitting: Family Medicine

## 2011-11-21 ENCOUNTER — Encounter: Payer: Self-pay | Admitting: *Deleted

## 2011-11-21 ENCOUNTER — Encounter: Payer: Self-pay | Admitting: Family Medicine

## 2011-11-21 VITALS — BP 116/60 | HR 71 | Resp 16 | Ht 65.0 in | Wt 149.0 lb

## 2011-11-21 DIAGNOSIS — M549 Dorsalgia, unspecified: Secondary | ICD-10-CM

## 2011-11-21 DIAGNOSIS — R5381 Other malaise: Secondary | ICD-10-CM

## 2011-11-21 DIAGNOSIS — R5383 Other fatigue: Secondary | ICD-10-CM

## 2011-11-21 DIAGNOSIS — F341 Dysthymic disorder: Secondary | ICD-10-CM

## 2011-11-21 DIAGNOSIS — E785 Hyperlipidemia, unspecified: Secondary | ICD-10-CM

## 2011-11-21 DIAGNOSIS — R7302 Impaired glucose tolerance (oral): Secondary | ICD-10-CM | POA: Insufficient documentation

## 2011-11-21 DIAGNOSIS — R7309 Other abnormal glucose: Secondary | ICD-10-CM

## 2011-11-21 DIAGNOSIS — Z23 Encounter for immunization: Secondary | ICD-10-CM

## 2011-11-21 DIAGNOSIS — G4700 Insomnia, unspecified: Secondary | ICD-10-CM

## 2011-11-21 MED ORDER — CYCLOBENZAPRINE HCL 10 MG PO TABS: ORAL_TABLET | ORAL | Status: DC

## 2011-11-21 MED ORDER — KETOROLAC TROMETHAMINE 60 MG/2ML IM SOLN
60.0000 mg | Freq: Once | INTRAMUSCULAR | Status: AC
  Administered 2011-11-21: 60 mg via INTRAMUSCULAR

## 2011-11-21 MED ORDER — PAROXETINE HCL 10 MG PO TABS: 10.0000 mg | ORAL_TABLET | ORAL | Status: DC

## 2011-11-21 MED ORDER — INFLUENZA VAC TYPES A & B PF IM SUSP: 0.5000 mL | Freq: Once | INTRAMUSCULAR | Status: AC

## 2011-11-21 NOTE — Progress Notes (Signed)
  Subjective:    Patient ID: Regina Lamb, female    DOB: 19-Jun-1966, 45 y.o.   MRN: [id number]  HPI Pt reports that from 11/14 to 11/22 she was at home with what she thinks is strep throat, states she tried to get appt here was told to go to urgent care , and opted not to and let it "run its course" Also reports dizziness. C/o in creased back pain x 3 weeks, broke it while in the [employer] 20 years ago, no interest in additional pain medication, burt will accept toradol in the office. States her biggest problem is stress and lack of sleep . Uses melatonin but still has very interrupted sleep. Reports she was out of work for approx 8 weeks  Approximately 18 months ago with similar complaint. Does not feel she is at that point at this time however, and I explained if she feels so stressed needing that much time out of work , she will need to see mental health for that Review of Systems See HPI Denies recent fever or chills.c/o fatigue and excess stress Denies sinus pressure, nasal congestion, ear pain or sore throat. Denies chest congestion, productive cough or wheezing. Denies chest pains, palpitations and leg swelling Denies abdominal pain, nausea, vomiting,diarrhea or constipation.   Denies dysuria, frequency, hesitancy or incontinence. C/o increased back pain radiating to buttocks Denies headaches, seizures, numbness, or tingling. C/o anxiety , and stress, overwhelmed Denies skin break down or rash.        Objective:   Physical Exam  Patient alert and oriented and in no cardiopulmonary distress.  HEENT: No facial asymmetry, EOMI, no sinus tenderness,  oropharynx pink and moist.  Neck supple no adenopathy.  Chest: Clear to auscultation bilaterally.  CVS: S1, S2 no murmurs, no S3.  ABD: Soft non tender. Bowel sounds normal.  Ext: No edema  [id number]  ROM spine, adequate in , shoulders, hips and knees.  Skin: Intact, no ulcerations or rash noted.  Psych: Good eye  contact, normal affect. Memory intact  anxious  And depressed appearing.  CNS: CN 2-12 intact, power, tone and sensation normal throughout.      Assessment & Plan:

## 2011-11-21 NOTE — Assessment & Plan Note (Signed)
Continues to have disturbed sleep usingmeltonin

## 2011-11-21 NOTE — Patient Instructions (Addendum)
F/u in 2 months.  Injection today for back pain, and muscle relaxant sent to your pharmacy for bedtime uuse only.  New med to help with stress and anxiety.  Labs today, lipid and hBA1c and tSH    Work excuse from today to return 11/23/2011  Flu vaccine today

## 2011-11-21 NOTE — Assessment & Plan Note (Addendum)
Acute flare in symptoms wants toradol

## 2011-11-21 NOTE — Assessment & Plan Note (Signed)
Has changed diet , wants this re checked due to family h/o diabetes

## 2011-11-22 LAB — HEMOGLOBIN A1C
Hgb A1c MFr Bld: 5.7 % — ABNORMAL HIGH (ref <5.7)
Mean Plasma Glucose: 117 mg/dL — ABNORMAL HIGH (ref <117)

## 2011-11-22 LAB — LIPID PANEL
Total CHOL/HDL Ratio: 3.5 Ratio
VLDL: 10 mg/dL (ref 0–40)

## 2011-11-22 NOTE — Assessment & Plan Note (Signed)
Increased and uncontrolled, not suicidal or homicidal, will start paxil

## 2012-01-19 ENCOUNTER — Encounter: Payer: Self-pay | Admitting: Family Medicine

## 2012-01-23 ENCOUNTER — Encounter: Payer: Self-pay | Admitting: Family Medicine

## 2012-01-23 ENCOUNTER — Ambulatory Visit: Payer: BC Managed Care – PPO | Admitting: Family Medicine

## 2013-03-28 DIAGNOSIS — R079 Chest pain, unspecified: Secondary | ICD-10-CM

## 2015-02-10 ENCOUNTER — Emergency Department (HOSPITAL_COMMUNITY): Payer: Self-pay

## 2015-02-10 ENCOUNTER — Emergency Department (HOSPITAL_COMMUNITY)
Admission: EM | Admit: 2015-02-10 | Discharge: 2015-02-10 | Disposition: A | Discharge status: Home / Self Care | Payer: Self-pay | Attending: Emergency Medicine | Admitting: Emergency Medicine

## 2015-02-10 ENCOUNTER — Encounter (HOSPITAL_COMMUNITY): Payer: Self-pay

## 2015-02-10 DIAGNOSIS — Y998 Other external cause status: Secondary | ICD-10-CM | POA: Insufficient documentation

## 2015-02-10 DIAGNOSIS — S8992XA Unspecified injury of left lower leg, initial encounter: Secondary | ICD-10-CM | POA: Insufficient documentation

## 2015-02-10 DIAGNOSIS — Z79899 Other long term (current) drug therapy: Secondary | ICD-10-CM | POA: Insufficient documentation

## 2015-02-10 DIAGNOSIS — M25562 Pain in left knee: Secondary | ICD-10-CM

## 2015-02-10 DIAGNOSIS — X58XXXA Exposure to other specified factors, initial encounter: Secondary | ICD-10-CM | POA: Insufficient documentation

## 2015-02-10 DIAGNOSIS — M797 Fibromyalgia: Secondary | ICD-10-CM | POA: Insufficient documentation

## 2015-02-10 DIAGNOSIS — Y9301 Activity, walking, marching and hiking: Secondary | ICD-10-CM | POA: Insufficient documentation

## 2015-02-10 DIAGNOSIS — Z880 Allergy status to penicillin: Secondary | ICD-10-CM | POA: Insufficient documentation

## 2015-02-10 DIAGNOSIS — Y9289 Other specified places as the place of occurrence of the external cause: Secondary | ICD-10-CM | POA: Insufficient documentation

## 2015-02-10 DIAGNOSIS — Z8639 Personal history of other endocrine, nutritional and metabolic disease: Secondary | ICD-10-CM | POA: Insufficient documentation

## 2015-02-10 MED ORDER — HYDROCODONE-ACETAMINOPHEN 5-325 MG PO TABS: 1.0000 | ORAL_TABLET | Freq: Four times a day (QID) | ORAL | Status: DC | PRN

## 2015-02-10 NOTE — ED Provider Notes (Signed)
CSN: [id number]     Arrival date & time 02/10/15  0728 History   First MD Initiated Contact with Patient 02/10/15 [phone]     Chief Complaint  Patient presents with  . Knee Pain     (Consider location/radiation/quality/duration/timing/severity/associated sxs/prior Treatment) HPI Comments: Patient presents to the emergency department with chief complaint of left knee pain. She states that she was walking on Friday, when her knee twisted funny and she felt a pop. She states that she has had persistent pain that radiates to her foot since the injury. She is able to ambulate, but states it is very painful. She has tried resting and elevating the foot with minimal relief. It is aggravated with movement and walking. She denies any fevers or chills. Denies any numbness or weakness.  The history is provided by the patient. No language interpreter was used.    Past Medical History  Diagnosis Date  . Vitamin D deficiency   . Fibromyalgia   . Fibromyalgia   . Chronic fatigue    Past Surgical History  Procedure Laterality Date  . Fibroid tumors removed  2010  . Laproscopy -endometriosis    . Tubal ligation  2002  . Abdominal hysterectomy     Family History  Problem Relation Age of Onset  . Diabetes Mother   . Hypertension Mother   . Hyperlipidemia Mother   . Rheum arthritis Mother   . Heart failure Mother   . Breast cancer Mother   . Cancer Mother     breast  . Dementia Father   . Cataracts Father   . Hyperlipidemia Father   . Hypertension Father   . Heart disease Brother   . Cancer Maternal Grandmother     lung  . Depression Sister    History  Substance Use Topics  . Smoking status: Never Smoker   . Smokeless tobacco: Not on file  . Alcohol Use: No   OB History    No data available     Review of Systems  Constitutional: Negative for fever and chills.  Respiratory: Negative for shortness of breath.   Cardiovascular: Negative for chest pain.  Gastrointestinal: Negative  for nausea, vomiting, diarrhea and constipation.  Genitourinary: Negative for dysuria.  Musculoskeletal: Positive for arthralgias.      Allergies  Penicillins and Sulfonamide derivatives  Home Medications   Prior to Admission medications   Medication Sig Start Date End Date Taking? Authorizing Provider  butalbital-acetaminophen-caffeine (FIORICET) [phone] MG per tablet One tablet twice daily as needed for severe headache 08/02/11   Kerri Perches, MD  Chlorphen-Phenyleph-ASA (ALKA-SELTZER PLUS COLD PO) Take by mouth.      Historical Provider, MD  cyclobenzaprine (FLEXERIL) 10 MG tablet One tablet at bedtime , as needed , for back spasm 11/21/11   Kerri Perches, MD  ferrous gluconate (FERGON) 325 MG tablet Take 325 mg by mouth daily with breakfast.      Historical Provider, MD  ibuprofen (ADVIL,MOTRIN) 600 MG tablet One tablet twice daily as needed  For severe headache 08/02/11   Kerri Perches, MD  Melatonin 5 MG CAPS Take by mouth at bedtime as needed.      Historical Provider, MD  PARoxetine (PAXIL) 10 MG tablet Take 1 tablet (10 mg total) by mouth every morning. 11/21/11 11/20/12  Kerri Perches, MD  phenylephrine (SUDAFED PE) 10 MG TABS Take 10 mg by mouth every 4 (four) hours as needed.      Historical Provider, MD  topiramate (TOPAMAX)  25 MG tablet Take 1 tablet (25 mg total) by mouth at bedtime. 08/02/11 08/01/12  Kerri PerchesMargaret E Simpson, MD  zolpidem (AMBIEN) 10 MG tablet Take 10 mg by mouth at bedtime as needed. As needed for insomnia       Historical Provider, MD   BP 98/77 mmHg  Pulse 81  Temp(Src) 98 F (36.7 C) (Oral)  Resp 20  Ht 5\' 5"  (1.651 m)  Wt 155 lb (70.308 kg)  BMI 25.79 kg/m2  SpO2 100% Physical Exam  Constitutional: She is oriented to person, place, and time. She appears well-developed and well-nourished.  HENT:  Head: Normocephalic and atraumatic.  Eyes: Conjunctivae and EOM are normal.  Neck: Normal range of motion.  Cardiovascular: Normal  rate.   Pulmonary/Chest: Effort normal.  Abdominal: She exhibits no distension.  Musculoskeletal: Normal range of motion.  Left knee tender to palpation over the patella and along the medial joint line, no obvious swelling, deformity, or abnormality, range of motion strength is 5/5, no evidence of DVT or septic joint  Neurological: She is alert and oriented to person, place, and time.  Sensation intact  Skin: Skin is dry.  Psychiatric: She has a normal mood and affect. Her behavior is normal. Judgment and thought content normal.  Nursing note and vitals reviewed.   ED Course  Procedures (including critical care time) Labs Review Labs Reviewed - No data to display  Imaging Review Dg Knee Complete 4 Views Left  02/10/2015   CLINICAL DATA:  Left knee pain while walking for 5 days, initial encounter. No definitive injury.  EXAM: LEFT KNEE - COMPLETE 4+ VIEW  COMPARISON:  None.  FINDINGS: There is no evidence of fracture, dislocation, or joint effusion. There is no evidence of arthropathy or other focal bone abnormality. Soft tissues are unremarkable.  IMPRESSION: No acute abnormality noted.   Electronically Signed   By: Alcide CleverMark  Lukens M.D.   On: 02/10/2015 08:13     EKG Interpretation None      MDM   Final diagnoses:  Knee pain, acute, left    Patient with left knee pain. Patient states she may have twisted her knee but injured it while walking. She states that she felt a pop. It has not been giving out, but is quite painful. Plain films are negative. Will discharge with knee immobilizer, crutches, and pain medicine. Recommend follow-up with orthopedic surgery in 1 week if symptoms do not improve.   Roxy HorsemanRobert Gabbrielle Mcnicholas, PA-C 02/10/15 [id number]  Richardean Canalavid H Yao, MD 02/10/15 [id number]

## 2015-02-10 NOTE — Discharge Instructions (Signed)
Arthralgia °Your caregiver has diagnosed you as suffering from an arthralgia. Arthralgia means there is pain in a joint. This can come from many reasons including: °· Bruising the joint which causes soreness (inflammation) in the joint. °· Wear and tear on the joints which occur as we grow older (osteoarthritis). °· Overusing the joint. °· Various forms of arthritis. °· Infections of the joint. °Regardless of the cause of pain in your joint, most of these different pains respond to anti-inflammatory drugs and rest. The exception to this is when a joint is infected, and these cases are treated with antibiotics, if it is a bacterial infection. °HOME CARE INSTRUCTIONS  °· Rest the injured area for as long as directed by your caregiver. Then slowly start using the joint as directed by your caregiver and as the pain allows. Crutches as directed may be useful if the ankles, knees or hips are involved. If the knee was splinted or casted, continue use and care as directed. If an stretchy or elastic wrapping bandage has been applied today, it should be removed and re-applied every 3 to 4 hours. It should not be applied tightly, but firmly enough to keep swelling down. Watch toes and feet for swelling, bluish discoloration, coldness, numbness or excessive pain. If any of these problems (symptoms) occur, remove the ace bandage and re-apply more loosely. If these symptoms persist, contact your caregiver or return to this location. °· For the first 24 hours, keep the injured extremity elevated on pillows while lying down. °· Apply ice for 15-20 minutes to the sore joint every couple hours while awake for the first half day. Then 03-04 times per day for the first 48 hours. Put the ice in a plastic bag and place a towel between the bag of ice and your skin. °· Wear any splinting, casting, elastic bandage applications, or slings as instructed. °· Only take over-the-counter or prescription medicines for pain, discomfort, or fever as  directed by your caregiver. Do not use aspirin immediately after the injury unless instructed by your physician. Aspirin can cause increased bleeding and bruising of the tissues. °· If you were given crutches, continue to use them as instructed and do not resume weight bearing on the sore joint until instructed. °Persistent pain and inability to use the sore joint as directed for more than 2 to 3 days are warning signs indicating that you should see a caregiver for a follow-up visit as soon as possible. Initially, a hairline fracture (break in bone) may not be evident on X-rays. Persistent pain and swelling indicate that further evaluation, non-weight bearing or use of the joint (use of crutches or slings as instructed), or further X-rays are indicated. X-rays may sometimes not show a small fracture until a week or 10 days later. Make a follow-up appointment with your own caregiver or one to whom we have referred you. A radiologist (specialist in reading X-rays) may read your X-rays. Make sure you know how you are to obtain your X-ray results. Do not assume everything is normal if you do not hear from us. °SEEK MEDICAL CARE IF: °Bruising, swelling, or pain increases. °SEEK IMMEDIATE MEDICAL CARE IF:  °· Your fingers or toes are numb or blue. °· The pain is not responding to medications and continues to stay the same or get worse. °· The pain in your joint becomes severe. °· You develop a fever over 102° F (38.9° C). °· It becomes impossible to move or use the joint. °MAKE SURE YOU:  °·   Understand these instructions. °· Will watch your condition. °· Will get help right away if you are not doing well or get worse. °Document Released: 12/05/2005 Document Revised: 02/27/2012 Document Reviewed: 07/23/2008 °ExitCare® Patient Information ©2015 [employer]. This information is not intended to replace advice given to you by your health care provider. Make sure you discuss any questions you have with your health care  provider. °Knee Sprain °A knee sprain is a tear in one of the strong, fibrous tissues that connect the bones (ligaments) in your knee. The severity of the sprain depends on how much of the ligament is torn. The tear can be either partial or complete. °CAUSES  °Often, sprains are a result of a fall or injury. The force of the impact causes the fibers of your ligament to stretch too much. This excess tension causes the fibers of your ligament to tear. °SIGNS AND SYMPTOMS  °You may have some loss of motion in your knee. Other symptoms include: °· Bruising. °· Pain in the knee area. °· Tenderness of the knee to the touch. °· Swelling. °DIAGNOSIS  °To diagnose a knee sprain, your health care provider will physically examine your knee. Your health care provider may also suggest an X-ray exam of your knee to make sure no bones are broken. °TREATMENT  °If your ligament is only partially torn, treatment usually involves keeping the knee in a fixed position (immobilization) or bracing your knee for activities that require movement for several weeks. To do this, your health care provider will apply a bandage, cast, or splint to keep your knee from moving and to support your knee during movement until it heals. For a partially torn ligament, the healing process usually takes 4-6 weeks. °If your ligament is completely torn, depending on which ligament it is, you may need surgery to reconnect the ligament to the bone or reconstruct it. After surgery, a cast or splint may be applied and will need to stay on your knee for 4-6 weeks while your ligament heals. °HOME CARE INSTRUCTIONS °· Keep your injured knee elevated to decrease swelling. °· To ease pain and swelling, apply ice to the injured area: °¨ Put ice in a plastic bag. °¨ Place a towel between your skin and the bag. °¨ Leave the ice on for 20 minutes, 2-3 times a day. °· Only take medicine for pain as directed by your health care provider. °· Do not leave your knee  unprotected until pain and stiffness go away (usually 4-6 weeks). °· If you have a cast or splint, do not allow it to get wet. If you have been instructed not to remove it, cover it with a plastic bag when you shower or bathe. Do not swim. °· Your health care provider may suggest exercises for you to do during your recovery to prevent or limit permanent weakness and stiffness. °SEEK IMMEDIATE MEDICAL CARE IF: °· Your cast or splint becomes damaged. °· Your pain becomes worse. °· You have significant pain, swelling, or numbness below the cast or splint. °MAKE SURE YOU: °· Understand these instructions. °· Will watch your condition. °· Will get help right away if you are not doing well or get worse. °Document Released: 12/05/2005 Document Revised: 09/25/2013 Document Reviewed: 07/17/2013 °ExitCare® Patient Information ©2015 [employer]. This information is not intended to replace advice given to you by your health care provider. Make sure you discuss any questions you have with your health care provider. ° °

## 2015-02-10 NOTE — ED Notes (Signed)
Pt states she was walking Friday and heard a "pop" in her left knee. Has had pain down to her toes. Works night shift and has since been lying in bed with limited movement.

## 2015-08-28 ENCOUNTER — Other Ambulatory Visit: Payer: Self-pay | Admitting: Nurse Practitioner

## 2015-10-15 ENCOUNTER — Emergency Department (HOSPITAL_COMMUNITY)
Admission: EM | Admit: 2015-10-15 | Discharge: 2015-10-15 | Disposition: A | Discharge status: Home / Self Care | Payer: BLUE CROSS/BLUE SHIELD | Attending: Emergency Medicine | Admitting: Emergency Medicine

## 2015-10-15 ENCOUNTER — Encounter (HOSPITAL_COMMUNITY): Payer: Self-pay | Admitting: Emergency Medicine

## 2015-10-15 DIAGNOSIS — Y9289 Other specified places as the place of occurrence of the external cause: Secondary | ICD-10-CM | POA: Insufficient documentation

## 2015-10-15 DIAGNOSIS — S51812A Laceration without foreign body of left forearm, initial encounter: Secondary | ICD-10-CM | POA: Diagnosis not present

## 2015-10-15 DIAGNOSIS — Z880 Allergy status to penicillin: Secondary | ICD-10-CM | POA: Diagnosis not present

## 2015-10-15 DIAGNOSIS — Y9389 Activity, other specified: Secondary | ICD-10-CM | POA: Insufficient documentation

## 2015-10-15 DIAGNOSIS — Z79899 Other long term (current) drug therapy: Secondary | ICD-10-CM | POA: Insufficient documentation

## 2015-10-15 DIAGNOSIS — M797 Fibromyalgia: Secondary | ICD-10-CM | POA: Insufficient documentation

## 2015-10-15 DIAGNOSIS — S59912A Unspecified injury of left forearm, initial encounter: Secondary | ICD-10-CM | POA: Diagnosis present

## 2015-10-15 DIAGNOSIS — Y998 Other external cause status: Secondary | ICD-10-CM | POA: Diagnosis not present

## 2015-10-15 DIAGNOSIS — W25XXXA Contact with sharp glass, initial encounter: Secondary | ICD-10-CM | POA: Insufficient documentation

## 2015-10-15 DIAGNOSIS — E559 Vitamin D deficiency, unspecified: Secondary | ICD-10-CM | POA: Diagnosis not present

## 2015-10-15 NOTE — ED Provider Notes (Signed)
CSN: [id number]     Arrival date & time 10/15/15  0844 History  By signing my name below, I, Regina Lamb, attest that this documentation has been prepared under the direction and in the presence of Raeford RazorStephen Ammanda Dobbins, MD. Electronically Signed: Angelene GiovanniEmmanuella Lamb, ED Scribe. 10/15/2015. 9:02 AM.    Chief Complaint  Patient presents with  . Extremity Laceration   The history is provided by the patient. No language interpreter was used.   HPI Comments: [name] is a 49 y.o. female who presents to the Emergency Department status post left forearm laceration that occurred last night when she accidentally cut it on a jagged glass. She reports that she had her last Tetanus vaccine last month. No active bleeding noted at this time. No alleviating noted as well.    Past Medical History  Diagnosis Date  . Vitamin D deficiency   . Fibromyalgia   . Fibromyalgia   . Chronic fatigue    Past Surgical History  Procedure Laterality Date  . Fibroid tumors removed  2010  . Laproscopy -endometriosis    . Tubal ligation  2002  . Abdominal hysterectomy     Family History  Problem Relation Age of Onset  . Diabetes Mother   . Hypertension Mother   . Hyperlipidemia Mother   . Rheum arthritis Mother   . Heart failure Mother   . Breast cancer Mother   . Cancer Mother     breast  . Dementia Father   . Cataracts Father   . Hyperlipidemia Father   . Hypertension Father   . Heart disease Brother   . Cancer Maternal Grandmother     lung  . Depression Sister    Social History  Substance Use Topics  . Smoking status: Never Smoker   . Smokeless tobacco: None  . Alcohol Use: No   OB History    No data available     Review of Systems  Constitutional: Negative for fever.  Skin: Positive for wound.  All other systems reviewed and are negative.     Allergies  Penicillins and Sulfonamide derivatives  Home Medications   Prior to Admission medications   Medication Sig Start Date End  Date Taking? Authorizing Provider  butalbital-acetaminophen-caffeine (FIORICET) [phone] MG per tablet One tablet twice daily as needed for severe headache 08/02/11   Kerri PerchesMargaret E Simpson, MD  Chlorphen-Phenyleph-ASA (ALKA-SELTZER PLUS COLD PO) Take by mouth.      Historical Provider, MD  cyclobenzaprine (FLEXERIL) 10 MG tablet One tablet at bedtime , as needed , for back spasm 11/21/11   Kerri PerchesMargaret E Simpson, MD  ferrous gluconate (FERGON) 325 MG tablet Take 325 mg by mouth daily with breakfast.      Historical Provider, MD  HYDROcodone-acetaminophen (NORCO/VICODIN) 5-325 MG per tablet Take 1-2 tablets by mouth every 6 (six) hours as needed. 02/10/15   Roxy Horsemanobert Browning, PA-C  ibuprofen (ADVIL,MOTRIN) 600 MG tablet One tablet twice daily as needed  For severe headache 08/02/11   Kerri PerchesMargaret E Simpson, MD  Melatonin 5 MG CAPS Take by mouth at bedtime as needed.      Historical Provider, MD  PARoxetine (PAXIL) 10 MG tablet Take 1 tablet (10 mg total) by mouth every morning. 11/21/11 11/20/12  Kerri PerchesMargaret E Simpson, MD  phenylephrine (SUDAFED PE) 10 MG TABS Take 10 mg by mouth every 4 (four) hours as needed.      Historical Provider, MD  topiramate (TOPAMAX) 25 MG tablet Take 1 tablet (25 mg total) by mouth at  bedtime. 08/02/11 08/01/12  Kerri Perches, MD  zolpidem (AMBIEN) 10 MG tablet Take 10 mg by mouth at bedtime as needed. As needed for insomnia       Historical Provider, MD   BP 122/69 mmHg  Pulse 60  Temp(Src) 98.2 F (36.8 C) (Oral)  Resp 18  Ht  (1.651 m)  Wt 160 lb (72.576 kg)  BMI 26.63 kg/m2  SpO2 100% Physical Exam  Constitutional: She is oriented to person, place, and time. She appears well-developed and well-nourished. No distress.  HENT:  Head: Normocephalic and atraumatic.  Eyes: EOM are normal.  Neck: Normal range of motion.  Cardiovascular: Normal rate, regular rhythm and normal heart sounds.   Pulmonary/Chest: Effort normal and breath sounds normal.  Abdominal: Soft. She  exhibits no distension. There is no tenderness.  Musculoskeletal: Normal range of motion.  Neurological: She is alert and oriented to person, place, and time.  Skin: Skin is warm and dry.  6cm linear laceration to volar aspect of left forearm, extends into dermis. No active bleeding. NVI distally  Psychiatric: She has a normal mood and affect. Judgment normal.  Nursing note and vitals reviewed.   ED Course  Procedures (including critical care time) DIAGNOSTIC STUDIES: Oxygen Saturation is 100% on RA, normal by my interpretation.    COORDINATION OF CARE: 9:01 AM- Pt advised of plan for treatment and pt agrees. Pt will receive bandages here and explained the healing process. No lac repair at this time.    EKG Interpretation None      MDM   Final diagnoses:  Laceration of forearm, left, initial encounter   49 year old female with laceration to her left forearm. Extensive into the dermis but not through it. Injury happened yesterday. Should heal fine with local wound care. Tetanus up-to-date.  I personally preformed the services scribed in my presence. The recorded information has been reviewed is accurate. Raeford Razor, MD.    Raeford Razor, MD 10/25/15 [phone]

## 2015-10-15 NOTE — Discharge Instructions (Signed)

## 2015-10-15 NOTE — ED Notes (Signed)
Pt c/o of laceration to LT anterior forearm. Pt states she cut it on jagged glass yesterday. Pt states she received tetanus booster a couple of weeks ago after another laceration. No bleeding noted. Wound appears to be healing well at this time.

## 2015-11-16 IMAGING — CR DG KNEE COMPLETE 4+V*L*
4 series · 4 of 4 positions shown · non-contrast
Comparison: None.

CLINICAL DATA: Left knee pain while walking for 5 days, initial
encounter. No definitive injury.

EXAM:
LEFT KNEE - COMPLETE 4+ VIEW

[knee ap]
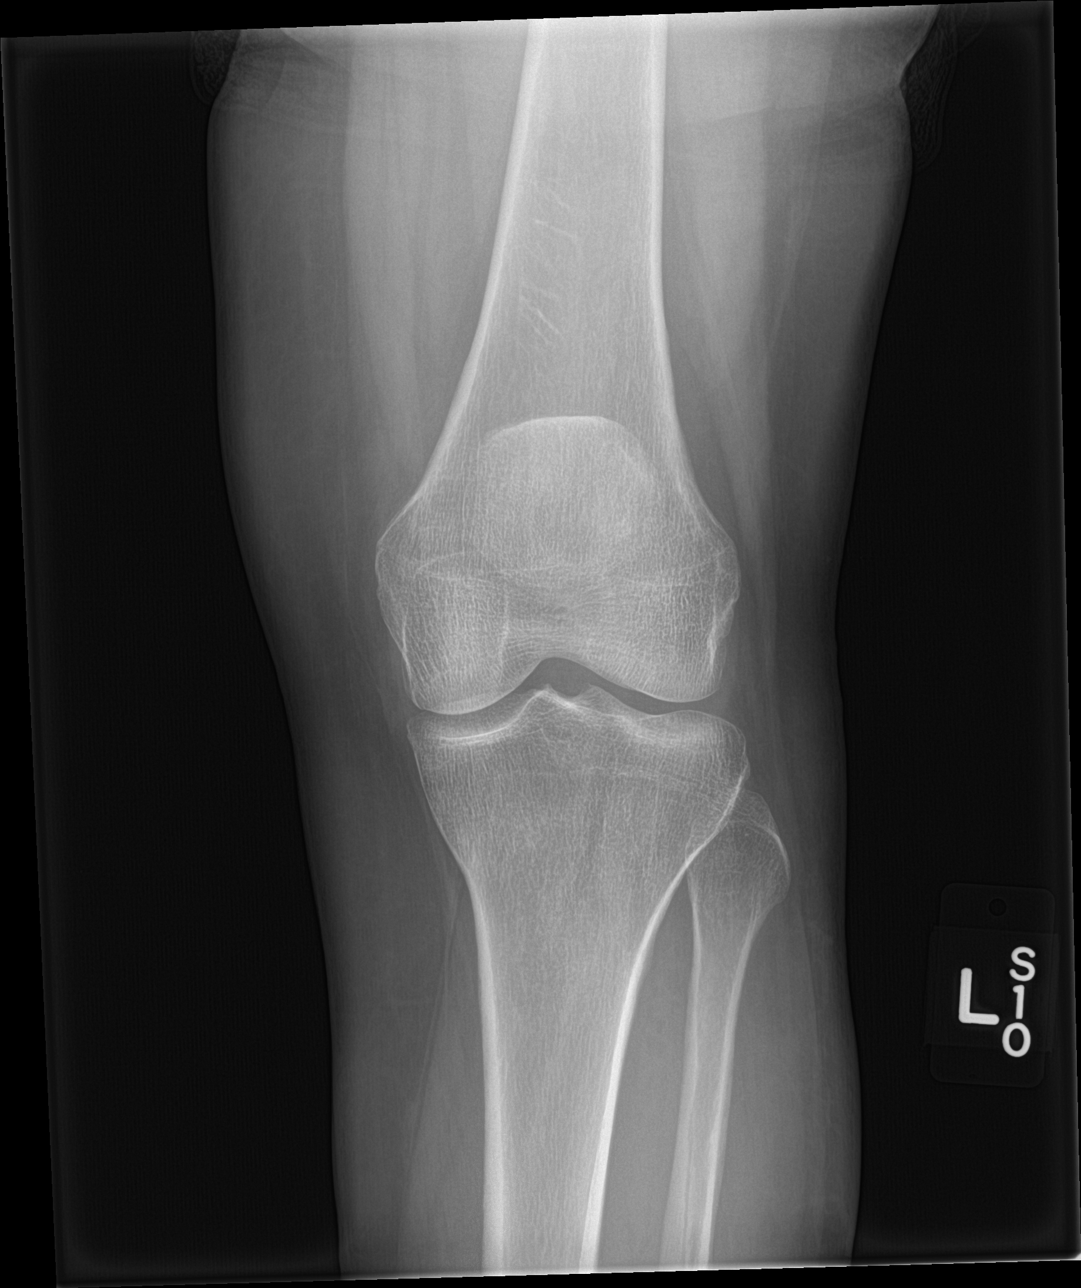

[knee obl (1 of 2)]
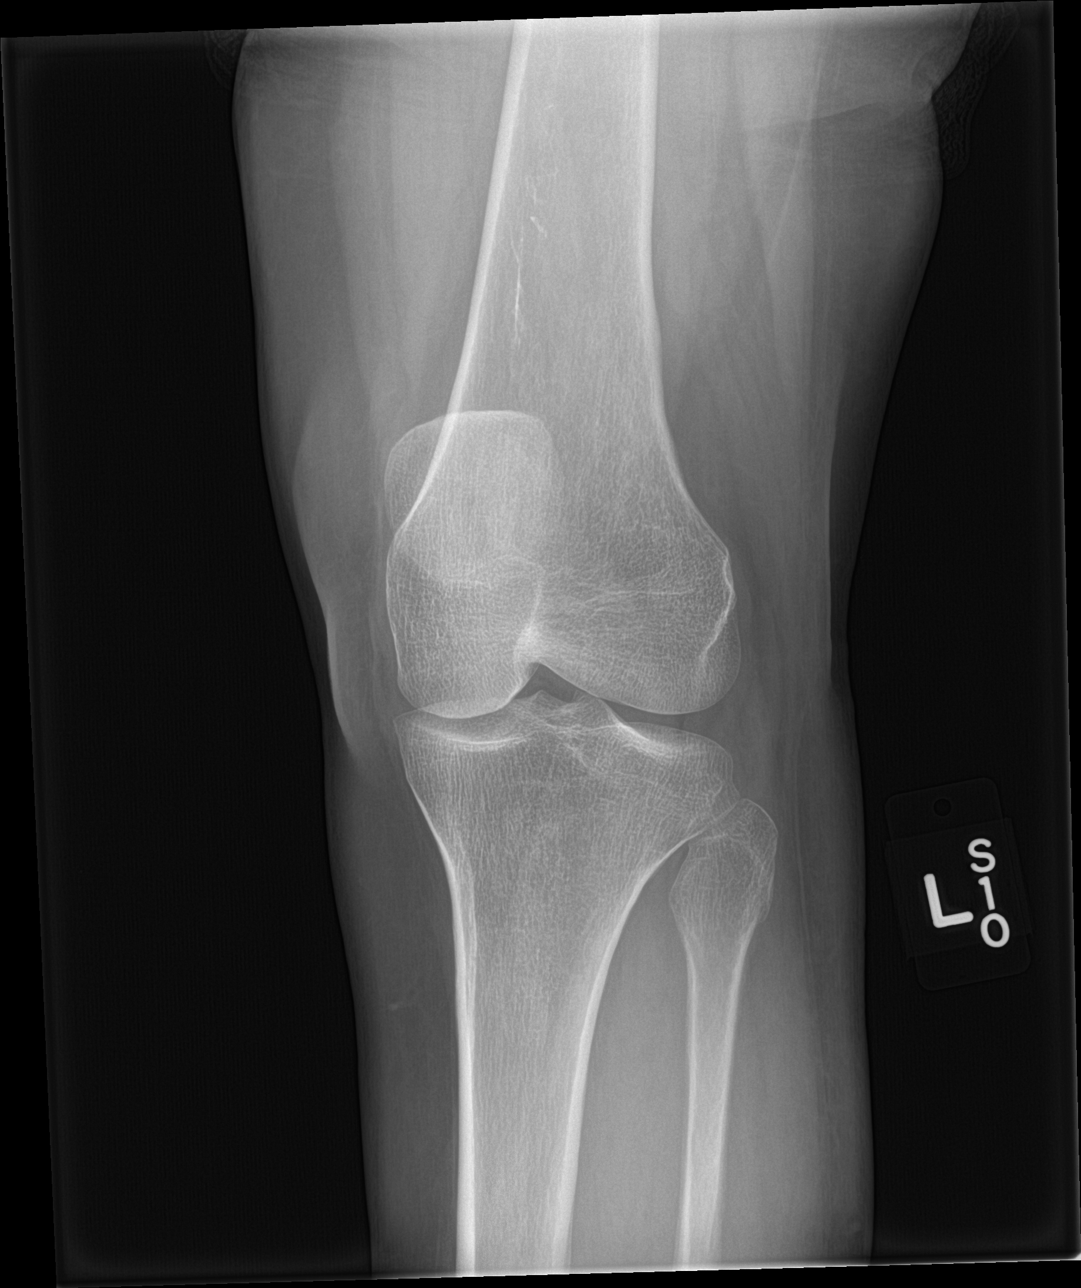

[knee obl (2 of 2)]
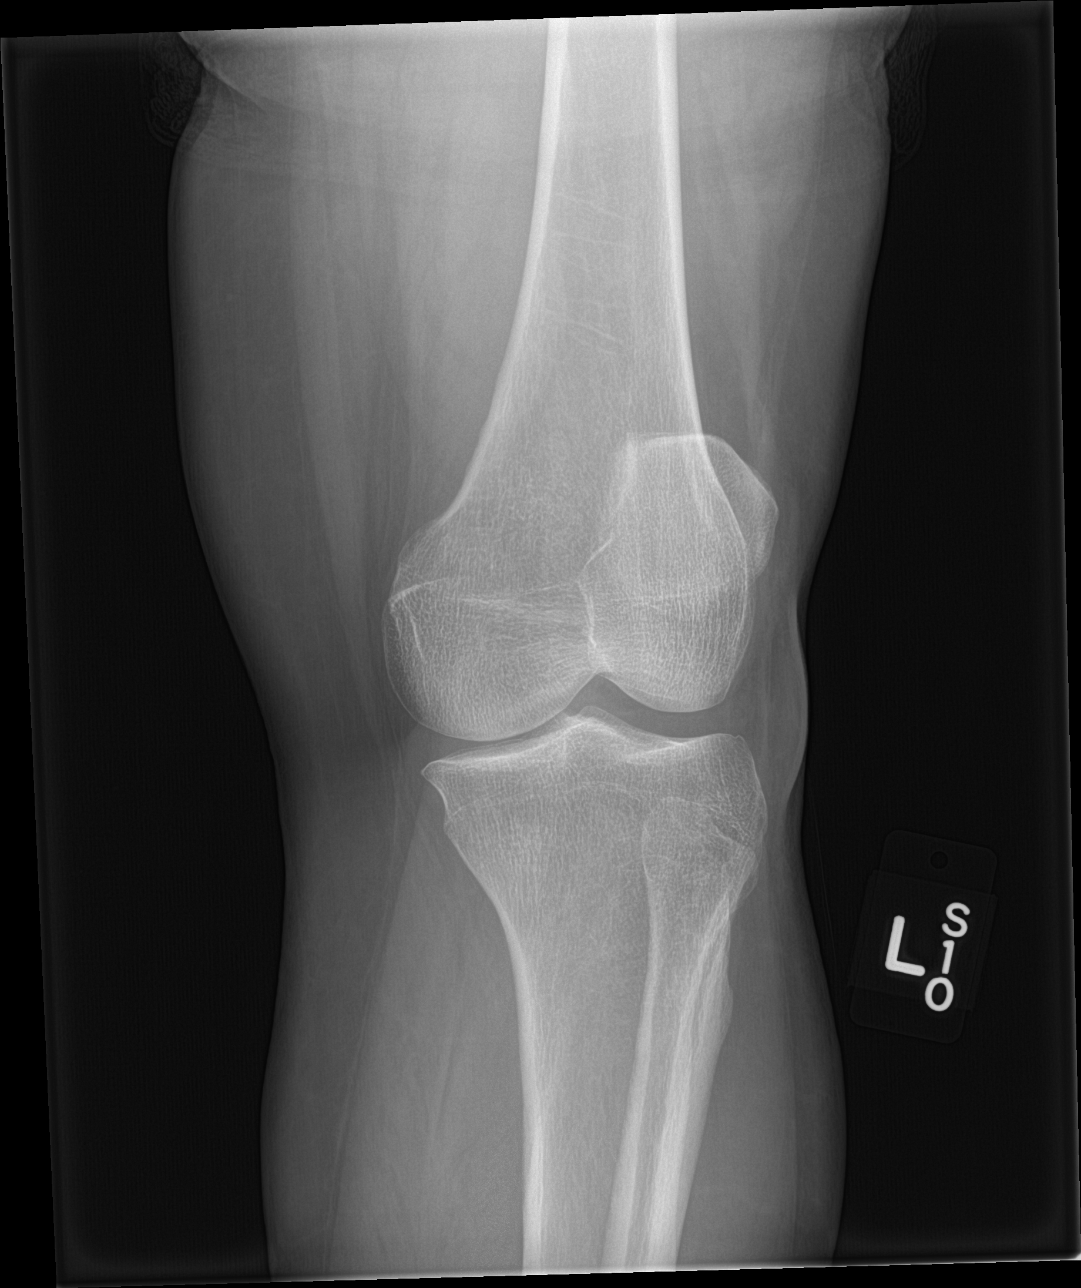

[knee lat]
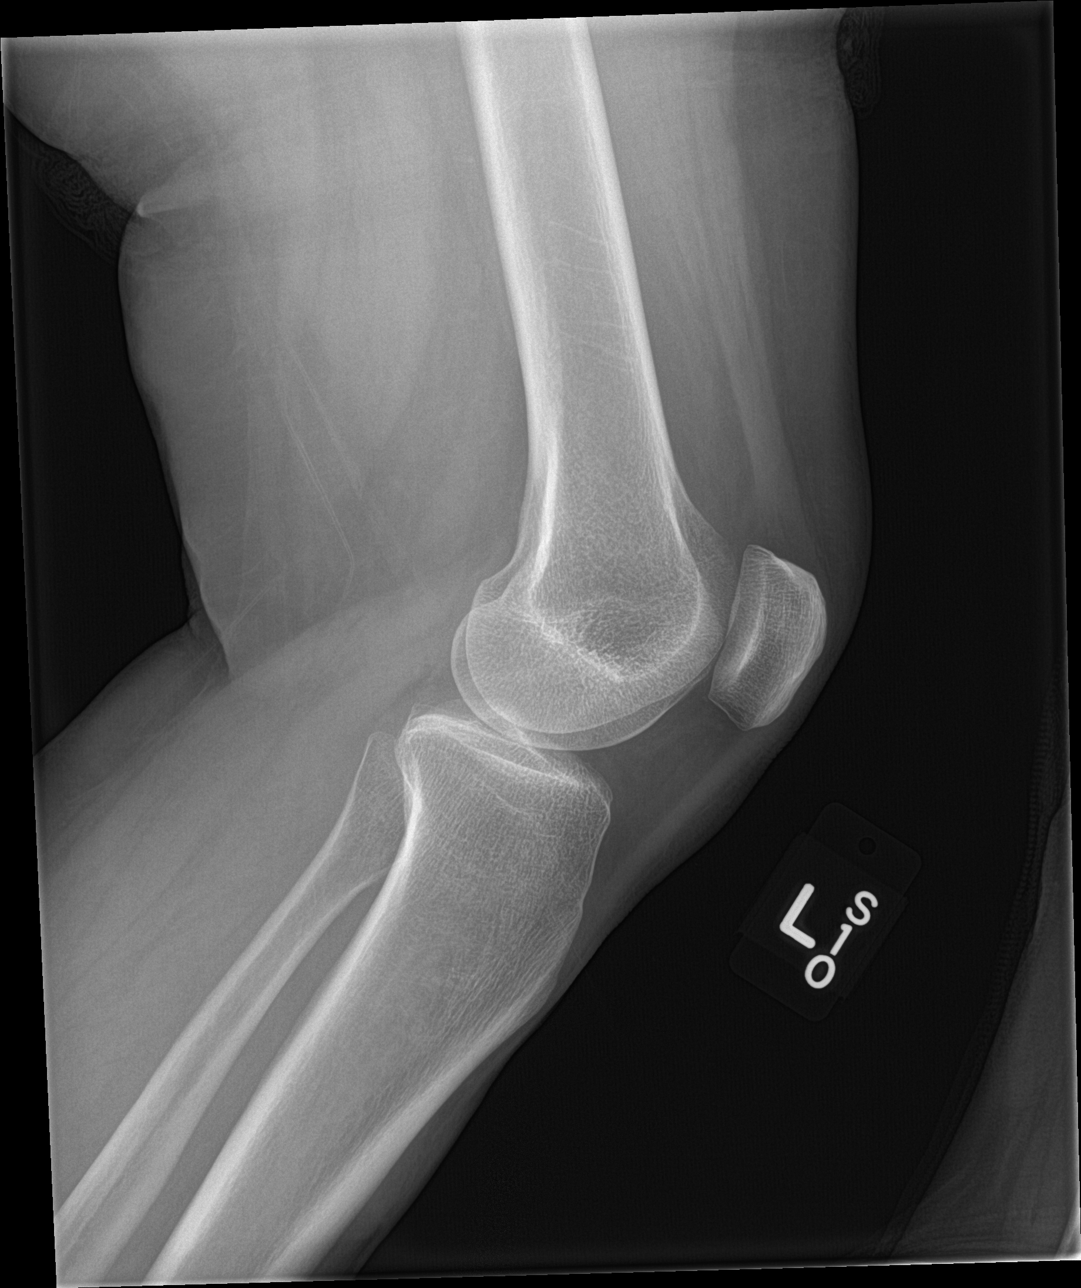

[4 of 4 positions shown; findings below may reference images not displayed]

FINDINGS: There is no evidence of fracture, dislocation, or joint effusion.
There is no evidence of arthropathy or other focal bone abnormality.
Soft tissues are unremarkable.
IMPRESSION: No acute abnormality noted.

## 2015-12-20 DIAGNOSIS — C50919 Malignant neoplasm of unspecified site of unspecified female breast: Secondary | ICD-10-CM

## 2015-12-20 HISTORY — DX: Malignant neoplasm of unspecified site of unspecified female breast: C50.919

## 2019-12-18 ENCOUNTER — Other Ambulatory Visit: Payer: Self-pay

## 2019-12-18 ENCOUNTER — Ambulatory Visit: Payer: 59 | Attending: Internal Medicine

## 2019-12-18 DIAGNOSIS — Z20822 Contact with and (suspected) exposure to covid-19: Secondary | ICD-10-CM

## 2019-12-19 LAB — NOVEL CORONAVIRUS, NAA: SARS-CoV-2, NAA: NOT DETECTED

## 2020-06-17 ENCOUNTER — Encounter (HOSPITAL_COMMUNITY): Payer: Self-pay

## 2020-06-17 ENCOUNTER — Other Ambulatory Visit: Payer: Self-pay

## 2020-06-17 ENCOUNTER — Emergency Department (HOSPITAL_COMMUNITY)
Admission: EM | Admit: 2020-06-17 | Discharge: 2020-06-17 | Disposition: A | Discharge status: Home / Self Care | Payer: No Typology Code available for payment source | Attending: Emergency Medicine | Admitting: Emergency Medicine

## 2020-06-17 DIAGNOSIS — Y92007 Garden or yard of unspecified non-institutional (private) residence as the place of occurrence of the external cause: Secondary | ICD-10-CM | POA: Diagnosis not present

## 2020-06-17 DIAGNOSIS — Z880 Allergy status to penicillin: Secondary | ICD-10-CM | POA: Insufficient documentation

## 2020-06-17 DIAGNOSIS — Y999 Unspecified external cause status: Secondary | ICD-10-CM | POA: Diagnosis not present

## 2020-06-17 DIAGNOSIS — T7840XA Allergy, unspecified, initial encounter: Secondary | ICD-10-CM | POA: Insufficient documentation

## 2020-06-17 DIAGNOSIS — Y93H2 Activity, gardening and landscaping: Secondary | ICD-10-CM | POA: Diagnosis not present

## 2020-06-17 DIAGNOSIS — W57XXXA Bitten or stung by nonvenomous insect and other nonvenomous arthropods, initial encounter: Secondary | ICD-10-CM | POA: Insufficient documentation

## 2020-06-17 DIAGNOSIS — S60561A Insect bite (nonvenomous) of right hand, initial encounter: Secondary | ICD-10-CM | POA: Diagnosis present

## 2020-06-17 MED ORDER — EPINEPHRINE 0.3 MG/0.3ML IJ SOAJ: 0.3000 mg | INTRAMUSCULAR | 0 refills | Status: AC | PRN

## 2020-06-17 MED ORDER — FAMOTIDINE 20 MG PO TABS: 20.0000 mg | ORAL_TABLET | Freq: Two times a day (BID) | ORAL | 0 refills | Status: AC

## 2020-06-17 MED ORDER — DIPHENHYDRAMINE HCL 25 MG PO TABS: 25.0000 mg | ORAL_TABLET | Freq: Four times a day (QID) | ORAL | 0 refills | Status: AC | PRN

## 2020-06-17 MED ORDER — FAMOTIDINE IN NACL 20-0.9 MG/50ML-% IV SOLN
20.0000 mg | Freq: Once | INTRAVENOUS | Status: AC
  Administered 2020-06-17: 20 mg via INTRAVENOUS
  Filled 2020-06-17: qty 50

## 2020-06-17 MED ORDER — DIPHENHYDRAMINE HCL 50 MG/ML IJ SOLN
25.0000 mg | Freq: Once | INTRAMUSCULAR | Status: AC
  Administered 2020-06-17: 25 mg via INTRAVENOUS
  Filled 2020-06-17: qty 1

## 2020-06-17 NOTE — Discharge Instructions (Signed)
At this time there does not appear to be the presence of an emergent medical condition, however there is always the potential for conditions to change. Please read and follow the below instructions.  Please return to the Emergency Department immediately for any new or worsening symptoms . Please be sure to follow up with your Primary Care Provider within one week regarding your visit today; please call their office to schedule an appointment even if you are feeling better for a follow-up visit. Continue using the medications Benadryl and Pepcid as prescribed to help with your allergic reaction.  Benadryl and Pepcid are antihistamine medications that well make you sleepy so do not drive, drink alcohol or perform any dangerous activities while taking them. You have been prescribed an EpiPen today.  You may use this if you have signs of an anaphylactic reaction.  If you use the EpiPen please call 911 immediately and return to the ER for evaluation as the medication will wear off and your reaction may return. Using cool compresses may continue to help with your symptoms.  Get help right away if: You have symptoms of a very bad allergy reaction. These include: A swollen mouth, tongue, or throat. Pain or tightness in your chest. Trouble breathing. Being short of breath. Dizziness. Fainting. Very bad pain in your belly (abdomen). Throwing up (vomiting). Watery poop (diarrhea). You have fever or chills You have any new/concerning or worsening of symptoms  Please read the additional information packets attached to your discharge summary.  Do not take your medicine if  develop an itchy rash, swelling in your mouth or lips, or difficulty breathing; call 911 and seek immediate emergency medical attention if this occurs.  You may review your lab tests and imaging results in their entirety on your MyChart account.  Please discuss all results of fully with your primary care provider and other specialist  at your follow-up visit.  Note: Portions of this text may have been transcribed using voice recognition software. Every effort was made to ensure accuracy; however, inadvertent computerized transcription errors may still be present.

## 2020-06-17 NOTE — ED Provider Notes (Signed)
[facility] EMERGENCY DEPARTMENT Provider Note   CSN: [id number] Arrival date & time: 06/17/20  1949     History No chief complaint on file.   Regina Lamb is a 54 y.o. 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    Presents today for concern of bug bite to the right hand.  She was working in her garden around lunchtime today when she believes she was bit by an unknown insect.  She had a mild burning pain to the dorsum of her right hand, gradually worsened associated with swelling.  She took 1 Benadryl this afternoon without improvement of her symptoms.  She describes a mild burning sensation, constant, nonradiating worsened with palpation improved with rest.  Denies fever/chills, fall/injury, nausea/vomiting, abdominal pain, diarrhea, chest pain/shortness of breath, cough, swelling of the face/head/neck, voice change, trouble swallowing or any additional concerns.  Of note patient reports her Tdap is up-to-date through the [place]. HPI     Past Medical History:  Diagnosis Date  . Chronic fatigue   . Fibromyalgia   . Fibromyalgia   . Vitamin D deficiency     Patient Active Problem List   Diagnosis Date Noted  . Impaired glucose tolerance 11/21/2011  . Back pain with radiation 11/21/2011  . Headache(784.0) 08/02/2011  . UNSPECIFIED VITAMIN D DEFICIENCY 05/19/2010  . DEPRESSION/ANXIETY 05/19/2010  . FIBROMYALGIA 05/19/2010  . INSOMNIA UNSPECIFIED 05/19/2010  . FATIGUE 05/19/2010    Past Surgical History:  Procedure Laterality Date  . ABDOMINAL HYSTERECTOMY    . fibroid tumors removed  2010  . laproscopy -endometriosis    . TUBAL LIGATION  2002     OB History   No obstetric history on file.     Family History  Problem Relation Age of Onset  . Diabetes Mother   . Hypertension Mother   . Hyperlipidemia Mother   . Rheum arthritis Mother   . Heart failure Mother   . Breast  cancer Mother   . Cancer Mother        breast  . Dementia Father   . Cataracts Father   . Hyperlipidemia Father   . Hypertension Father   . Heart disease Brother   . Cancer Maternal Grandmother        lung  . Depression Sister     Social History   Tobacco Use  . Smoking status: Never Smoker  . Smokeless tobacco: Never Used  Substance Use Topics  . Alcohol use: No  . Drug use: No    Home Medications Prior to Admission medications   Medication Sig Start Date End Date Taking? Authorizing Provider  CALCIUM PO Take 1 tablet by mouth daily.    [provider]  cholecalciferol (VITAMIN D) 1000 UNITS tablet Take 5,000 Units by mouth daily.    [provider]  diphenhydrAMINE (BENADRYL) 25 MG tablet Take 1 tablet (25 mg total) by mouth every 6 (six) hours as needed. 06/17/20   Harlene Salts A, PA-C  EPINEPHrine 0.3 mg/0.3 mL IJ SOAJ injection Inject 0.3 mLs (0.3 mg total) into the muscle as needed for anaphylaxis. 06/17/20   Harlene Salts A, PA-C  famotidine (PEPCID) 20 MG tablet Take 1 tablet (20 mg total) by mouth 2 (two) times daily. 06/17/20   Harlene Salts A, PA-C  ferrous sulfate 325 (65 FE) MG tablet Take 325 mg by mouth daily with breakfast.    [provider]  ibuprofen (ADVIL,MOTRIN) 200 MG tablet  Take 600-800 mg by mouth every 6 (six) hours as needed for headache.    [provider]  Omega-3 Fatty Acids (FISH OIL PO) Take 1 capsule by mouth daily.    [provider]    Allergies    Penicillins and Sulfonamide derivatives  Review of Systems   Review of Systems  Constitutional: Negative.  Negative for chills and fever.  HENT: Negative.  Negative for facial swelling, sore throat, trouble swallowing and voice change.   Respiratory: Negative.  Negative for cough and shortness of breath.   Cardiovascular: Negative.  Negative for chest pain.  Gastrointestinal: Negative.  Negative for abdominal pain, diarrhea, nausea and  vomiting.  Musculoskeletal: Negative.  Negative for arthralgias and myalgias.  Skin: Positive for rash (Dorsum right hand).  Neurological: Negative.  Negative for weakness, numbness and headaches.    Physical Exam Updated Vital Signs BP 122/83   Pulse 76   Temp 98 F (36.7 C) (Oral)   Resp 12   Ht 5\' 5"  (1.651 m)   Wt 72.6 kg   SpO2 98%   BMI 26.63 kg/m   Physical Exam Constitutional:      General: She is not in acute distress.    Appearance: Normal appearance. She is well-developed. She is not ill-appearing or diaphoretic.  HENT:     Head: Normocephalic and atraumatic.  Eyes:     General: Vision grossly intact. Gaze aligned appropriately.     Pupils: Pupils are equal, round, and reactive to light.  Neck:     Trachea: Trachea and phonation normal.  Cardiovascular:     Rate and Rhythm: Normal rate and regular rhythm.     Pulses:          Radial pulses are 2+ on the right side and 2+ on the left side.  Pulmonary:     Effort: Pulmonary effort is normal. No respiratory distress.  Abdominal:     General: There is no distension.     Palpations: Abdomen is soft.     Tenderness: There is no abdominal tenderness. There is no guarding or rebound.  Musculoskeletal:        General: Normal range of motion.     Cervical back: Normal range of motion.     Comments: Right hand: Moderate swelling of the dorsum of the right hand with minimal erythema.  Appears to be a small bug bite present just proximal to the right first MCP, does not appear to have any residual foreign bodies.  No gross deformities, skin intact. Fingers appear normal. No snuffbox tenderness to palpation. No tenderness to palpation over flexor sheath.  Finger adduction/abduction intact with 5/5 strength.  Thumb opposition intact. Full active and resisted ROM to flexion/extension at wrist, MCP, PIP and DIP of all fingers.  FDS/FDP intact. Grip 5/5 strength.  Radial artery 2+ with <2sec cap refill in all fingers.   Sensation intact to light-tough in median/ulnar/radial distributions.  Compartments soft.  Full range of motion at the elbow without pain.   Skin:    General: Skin is warm and dry.  Neurological:     Mental Status: She is alert.     GCS: GCS eye subscore is 4. GCS verbal subscore is 5. GCS motor subscore is 6.     Comments: Speech is clear and goal oriented, follows commands Major Cranial nerves without deficit, no facial droop Moves extremities without ataxia, coordination intact  Psychiatric:        Behavior: Behavior normal.  ED Results / Procedures / Treatments   Labs (all labs ordered are listed, but only abnormal results are displayed) Labs Reviewed - No data to display  EKG None  Radiology No results found.  Procedures Procedures (including critical care time)  Medications Ordered in ED Medications  diphenhydrAMINE (BENADRYL) injection 25 mg (25 mg Intravenous Given 06/17/20 2154)  famotidine (PEPCID) IVPB 20 mg premix (0 mg Intravenous Stopped 06/17/20 2230)    ED Course  I have reviewed the triage vital signs and the nursing notes.  Pertinent labs & imaging results that were available during my care of the patient were reviewed by me and considered in my medical decision making (see chart for details).    MDM Rules/Calculators/A&P                          Additional History Obtained: 1. Nursing notes from this visit   Patient with what appears to be a local allergic reaction of the dorsum of the right hand secondary to bug bite. Patient denies any difficulty breathing or swallowing.  Pt has a patent airway without stridor and is handling secretions without difficulty; no angioedema. No blisters, no pustules, no warmth, no draining sinus tracts, no superficial abscesses, no bullous impetigo, no vesicles, no desquamation, no target lesions with dusky purpura or a central bulla. Not tender to touch. No concern for superimposed infection. No concern for SJS,  TEN, TSS, tick borne illness, syphilis or other life-threatening condition.  Was given Benadryl and Pepcid along with an ice pack here in the ED.  She was reassessed reports improvement of symptoms and is requesting discharge.  Will have patient continue Benadryl/Pepcid and ice packs at home.  Additionally there is no evidence of traumatic injury requiring imaging today.  Additionally no evidence of DVT, cellulitis, compartment syndrome, septic arthritis or other emergent pathologies.  Will prescribe EpiPen in case of reexposure and/or worsening reaction.  Patient informed that if she uses EpiPen she must call EMS and return to emergency department.  Additionally patient informed of precautions regarding antihistamines.  At this time there does not appear to be any evidence of an acute emergency medical condition and the patient appears stable for discharge with appropriate outpatient follow up. Diagnosis was discussed with patient who verbalizes understanding of care plan and is agreeable to discharge. I have discussed return precautions with patient who verbalizes understanding. Patient encouraged to follow-up with their PCP. All questions answered.   Note: Portions of this report may have been transcribed using voice recognition software. Every effort was made to ensure accuracy; however, inadvertent computerized transcription errors may still be present. Final Clinical Impression(s) / ED Diagnoses Final diagnoses:  Allergic reaction, initial encounter    Rx / DC Orders ED Discharge Orders         Ordered    diphenhydrAMINE (BENADRYL) 25 MG tablet  Every 6 hours PRN     Discontinue     06/17/20 2320    famotidine (PEPCID) 20 MG tablet  2 times daily     Discontinue     06/17/20 2320    EPINEPHrine 0.3 mg/0.3 mL IJ SOAJ injection  As needed     Discontinue     06/17/20 2320           Elizabeth Palau 06/17/20 2321    Jacalyn Lefevre, MD 06/17/20 2344

## 2020-06-17 NOTE — ED Triage Notes (Signed)
Pt comes in for a bug bite on her right hand.  Did not see the  Bug bite her/ but does have swelling to the right hand.  Took benadryl at 130p

## 2021-01-08 ENCOUNTER — Encounter: Payer: Self-pay | Admitting: Emergency Medicine

## 2021-01-08 ENCOUNTER — Other Ambulatory Visit: Payer: Self-pay

## 2021-01-08 ENCOUNTER — Ambulatory Visit
Admission: EM | Admit: 2021-01-08 | Discharge: 2021-01-08 | Disposition: A | Discharge status: Home / Self Care | Payer: No Typology Code available for payment source | Attending: Family Medicine | Admitting: Family Medicine

## 2021-01-08 DIAGNOSIS — B349 Viral infection, unspecified: Secondary | ICD-10-CM | POA: Diagnosis not present

## 2021-01-08 DIAGNOSIS — R509 Fever, unspecified: Secondary | ICD-10-CM

## 2021-01-08 DIAGNOSIS — R6883 Chills (without fever): Secondary | ICD-10-CM

## 2021-01-08 DIAGNOSIS — Z20822 Contact with and (suspected) exposure to covid-19: Secondary | ICD-10-CM | POA: Diagnosis not present

## 2021-01-08 DIAGNOSIS — J029 Acute pharyngitis, unspecified: Secondary | ICD-10-CM | POA: Diagnosis not present

## 2021-01-08 LAB — POCT RAPID STREP A (OFFICE): Rapid Strep A Screen: NEGATIVE

## 2021-01-08 NOTE — Discharge Instructions (Signed)
Your rapid strep test is negative.  A throat culture is pending; we will call you if it is positive requiring treatment.    Your COVID test is pending.  You should self quarantine until the test result is back.    Take Tylenol or ibuprofen as needed for fever or discomfort.  Rest and keep yourself hydrated.    Follow-up with your primary care provider if your symptoms are not improving.     

## 2021-01-08 NOTE — ED Provider Notes (Signed)
[facility]   [id number] 01/08/21 Arrival Time: 1532   CC: COVID symptoms  SUBJECTIVE: History from: patient.  Regina Lamb is a 55 y.o. female who presents with . Denies sick exposure to COVID, flu or strep. Denies recent travel. Has positive history of Covid last year. Has completed Covid vaccines and booster. Has been taking Dayquil and sudafed with no relief. There are no aggravating or alleviating factors. Denies previous symptoms in the past. Denies sinus pain, SOB, wheezing, chest pain, nausea, changes in bowel or bladder habits.    ROS: As per HPI.  All other pertinent ROS negative.     Past Medical History:  Diagnosis Date   Chronic fatigue    Fibromyalgia    Fibromyalgia    Vitamin D deficiency    Past Surgical History:  Procedure Laterality Date   ABDOMINAL HYSTERECTOMY     fibroid tumors removed  2010   laproscopy -endometriosis     TUBAL LIGATION  2002   Allergies  Allergen Reactions   Penicillins Shortness Of Breath and Rash    Chest pain, labored breathing   Sulfonamide Derivatives Rash    Chest pain, labored breathing   Current Facility-Administered Medications on File Prior to Encounter  Medication Dose Route Frequency Provider Last Rate Last Admin   Influenza (>/= 3 years) inactive virus vaccine (FLVIRIN/FLUZONE) injection SUSP 0.5 mL  0.5 mL Intramuscular Once Kerri Perches, MD       Current Outpatient Medications on File Prior to Encounter  Medication Sig Dispense Refill   CALCIUM PO Take 1 tablet by mouth daily.     cholecalciferol (VITAMIN D) 1000 UNITS tablet Take 5,000 Units by mouth daily.     diphenhydrAMINE (BENADRYL) 25 MG tablet Take 1 tablet (25 mg total) by mouth every 6 (six) hours as needed. 20 tablet 0   EPINEPHrine 0.3 mg/0.3 mL IJ SOAJ injection Inject 0.3 mLs (0.3 mg total) into the muscle as needed for anaphylaxis. 1 each 0   famotidine (PEPCID) 20 MG tablet Take 1 tablet (20 mg total) by mouth  2 (two) times daily. 10 tablet 0   ferrous sulfate 325 (65 FE) MG tablet Take 325 mg by mouth daily with breakfast.     ibuprofen (ADVIL,MOTRIN) 200 MG tablet Take 600-800 mg by mouth every 6 (six) hours as needed for headache.     Omega-3 Fatty Acids (FISH OIL PO) Take 1 capsule by mouth daily.     Social History   Socioeconomic History   Marital status: Divorced    Spouse name: Not on file   Number of children: Not on file   Years of education: Not on file   Highest education level: Not on file  Occupational History   Not on file  Tobacco Use   Smoking status: Never Smoker   Smokeless tobacco: Never Used  Substance and Sexual Activity   Alcohol use: No   Drug use: No   Sexual activity: Not on file  Other Topics Concern   Not on file  Social History Narrative   Not on file   Social Determinants of Health   Financial Resource Strain: Not on file  Food Insecurity: Not on file  Transportation Needs: Not on file  Physical Activity: Not on file  Stress: Not on file  Social Connections: Not on file  Intimate Partner Violence: Not on file   Family History  Problem Relation Age of Onset   Diabetes Mother    Hypertension Mother  Hyperlipidemia Mother    Rheum arthritis Mother    Heart failure Mother    Breast cancer Mother    Cancer Mother        breast   Dementia Father    Cataracts Father    Hyperlipidemia Father    Hypertension Father    Heart disease Brother    Cancer Maternal Grandmother        lung   Depression Sister     OBJECTIVE:  Vitals:   01/08/21 1658 01/08/21 1659  BP: 131/83   Pulse: 72   Resp: 18   Temp: 98.3 F (36.8 C)   TempSrc: Oral   SpO2: 95%   Weight:  168 lb (76.2 kg)     General appearance: alert; appears fatigued, but nontoxic; speaking in full sentences and tolerating own secretions HEENT: NCAT; Ears: EACs clear, TMs pearly gray; Eyes: PERRL.  EOM grossly intact. Sinuses: nontender; Nose: nares  patent with clear rhinorrhea, Throat: oropharynx erythematous, cobblestoning present, tonsils non erythematous or enlarged, uvula midline  Neck: supple without LAD Lungs: unlabored respirations, symmetrical air entry; cough: absent; no respiratory distress; CTAB Heart: regular rate and rhythm.  Radial pulses 2+ symmetrical bilaterally Skin: warm and dry Psychological: alert and cooperative; normal mood and affect  LABS:  Results for orders placed or performed during the hospital encounter of 01/08/21 (from the past 24 hour(s))  POCT rapid strep A     Status: None   Collection Time: 01/08/21  5:05 PM  Result Value Ref Range   Rapid Strep A Screen Negative Negative     ASSESSMENT & PLAN:  1. Viral illness   2. Exposure to COVID-19 virus   3. Sore throat   4. Fever, unspecified fever cause   5. Chills    Your rapid strep test is negative.  A throat culture is pending; we will call you if it is positive requiring treatment.    Continue supportive care at home COVID and flu testing ordered.  It will take between 2-3 days for test results. Someone will contact you regarding abnormal results.  Work note provided Patient should remain in quarantine until they have received Covid results.  If negative you may resume normal activities (go back to work/school) while practicing hand hygiene, social distance, and mask wearing.  If positive, patient should remain in quarantine for at least 5 days from symptom onset AND greater than 72 hours after symptoms resolution (absence of fever without the use of fever-reducing medication and improvement in respiratory symptoms), whichever is longer Get plenty of rest and push fluids Use OTC zyrtec for nasal congestion, runny nose, and/or sore throat Use OTC flonase for nasal congestion and runny nose Use medications daily for symptom relief Use OTC medications like ibuprofen or tylenol as needed fever or pain Call or go to the ED if you have any new or  worsening symptoms such as fever, worsening cough, shortness of breath, chest tightness, chest pain, turning blue, changes in mental status.  Reviewed expectations re: course of current medical issues. Questions answered. Outlined signs and symptoms indicating need for more acute intervention. Patient verbalized understanding. After Visit Summary given.         Moshe Cipro, NP 01/08/21 1738

## 2021-01-08 NOTE — ED Triage Notes (Signed)
Fever, chills, sore throat, nasal congestion for about 2 days.  Has tried salt water gargles and OTC medications with no relief.

## 2021-01-10 LAB — COVID-19, FLU A+B NAA
Influenza A, NAA: NOT DETECTED
Influenza B, NAA: NOT DETECTED
SARS-CoV-2, NAA: NOT DETECTED

## 2021-01-14 LAB — UPPER RESPIRATORY CULTURE, ROUTINE

## 2021-01-14 LAB — SPECIMEN STATUS REPORT

## 2021-03-11 ENCOUNTER — Other Ambulatory Visit: Payer: Self-pay | Admitting: Obstetrics & Gynecology

## 2022-03-14 ENCOUNTER — Other Ambulatory Visit: Payer: Self-pay

## 2022-03-14 ENCOUNTER — Ambulatory Visit
Admission: EM | Admit: 2022-03-14 | Discharge: 2022-03-14 | Disposition: A | Discharge status: Home / Self Care | Payer: No Typology Code available for payment source

## 2022-03-14 DIAGNOSIS — J069 Acute upper respiratory infection, unspecified: Secondary | ICD-10-CM | POA: Diagnosis not present

## 2022-03-14 DIAGNOSIS — J4540 Moderate persistent asthma, uncomplicated: Secondary | ICD-10-CM | POA: Diagnosis not present

## 2022-03-14 DIAGNOSIS — R070 Pain in throat: Secondary | ICD-10-CM

## 2022-03-14 LAB — POCT RAPID STREP A (OFFICE): Rapid Strep A Screen: NEGATIVE

## 2022-03-14 MED ORDER — PREDNISONE 50 MG PO TABS: 50.0000 mg | ORAL_TABLET | Freq: Every day | ORAL | 0 refills | Status: AC

## 2022-03-14 MED ORDER — PROMETHAZINE-DM 6.25-15 MG/5ML PO SYRP: 5.0000 mL | ORAL_SOLUTION | Freq: Four times a day (QID) | ORAL | 0 refills | Status: AC | PRN

## 2022-03-14 NOTE — ED Provider Notes (Signed)
?[facility] ? ? ?MRN: [id number] DOB: 07/30/1966 ? ?Subjective:  ? ?Regina Lamb is a 56 y.o. female with PMH of asthma presenting for 3 to 4-day history of acute onset persistent throat pain, cough, runny and stuffy nose, chest congestion, sinus congestion.  Has been using multiple over-the-counter medications with minimal relief.  States that she has her inhalers at home and does not need a refill.  She is not a smoker.  She did a COVID test at home and all were negative. ? ? ?Current Facility-Administered Medications:  ?  Influenza (>/= 3 years) inactive virus vaccine (FLVIRIN/FLUZONE) injection SUSP 0.5 mL, 0.5 mL, Intramuscular, Once, Kerri Perches, MD ? ?Current Outpatient Medications:  ?  Pseudoeph-Doxylamine-DM-APAP (DAYQUIL/NYQUIL COLD/FLU RELIEF PO), Take by mouth., Disp: , Rfl:  ?  pseudoephedrine (SUDAFED) 60 MG tablet, Take 60 mg by mouth every 4 (four) hours as needed for congestion., Disp: , Rfl:  ?  CALCIUM PO, Take 1 tablet by mouth daily., Disp: , Rfl:  ?  cholecalciferol (VITAMIN D) 1000 UNITS tablet, Take 5,000 Units by mouth daily., Disp: , Rfl:  ?  diphenhydrAMINE (BENADRYL) 25 MG tablet, Take 1 tablet (25 mg total) by mouth every 6 (six) hours as needed., Disp: 20 tablet, Rfl: 0 ?  EPINEPHrine 0.3 mg/0.3 mL IJ SOAJ injection, Inject 0.3 mLs (0.3 mg total) into the muscle as needed for anaphylaxis., Disp: 1 each, Rfl: 0 ?  famotidine (PEPCID) 20 MG tablet, Take 1 tablet (20 mg total) by mouth 2 (two) times daily., Disp: 10 tablet, Rfl: 0 ?  ferrous sulfate 325 (65 FE) MG tablet, Take 325 mg by mouth daily with breakfast., Disp: , Rfl:  ?  ibuprofen (ADVIL,MOTRIN) 200 MG tablet, Take 600-800 mg by mouth every 6 (six) hours as needed for headache., Disp: , Rfl:  ?  Omega-3 Fatty Acids (FISH OIL PO), Take 1 capsule by mouth daily., Disp: , Rfl:   ? ?Allergies  ?Allergen Reactions  ? Penicillins Shortness Of Breath and Rash  ?  Chest pain, labored breathing  ?  Penicillin G Hives  ? Strawberry Extract Itching  ? Latex Itching and Rash  ?  BLISTERS SKIN  ? Sulfonamide Derivatives Rash  ?  Chest pain, labored breathing  ? White Petrolatum Itching and Rash  ? ? ?Past Medical History:  ?Diagnosis Date  ? Chronic fatigue   ? Fibromyalgia   ? Fibromyalgia   ? Vitamin D deficiency   ?  ? ?Past Surgical History:  ?Procedure Laterality Date  ? ABDOMINAL HYSTERECTOMY    ? fibroid tumors removed  2010  ? laproscopy -endometriosis    ? TUBAL LIGATION  2002  ? ? ?Family History  ?Problem Relation Age of Onset  ? Diabetes Mother   ? Hypertension Mother   ? Hyperlipidemia Mother   ? Rheum arthritis Mother   ? Heart failure Mother   ? Breast cancer Mother   ? Cancer Mother   ?     breast  ? Dementia Father   ? Cataracts Father   ? Hyperlipidemia Father   ? Hypertension Father   ? Heart disease Brother   ? Cancer Maternal Grandmother   ?     lung  ? Depression Sister   ? ? ?Social History  ? ?Tobacco Use  ? Smoking status: Never  ? Smokeless tobacco: Never  ?Substance Use Topics  ? Alcohol use: No  ? Drug use: Never  ? ? ?ROS ? ? ?Objective:  ? ?  Vitals: ?BP 126/80 (BP Location: Right Arm)   Pulse 93   Temp 98.3 ?F (36.8 ?C) (Oral)   Resp (!) 22   SpO2 96%  ? ?Physical Exam ?Constitutional:   ?   General: She is not in acute distress. ?   Appearance: Normal appearance. She is well-developed and normal weight. She is not ill-appearing, toxic-appearing or diaphoretic.  ?HENT:  ?   Head: Normocephalic and atraumatic.  ?   Right Ear: Tympanic membrane, ear canal and external ear normal. No drainage or tenderness. No middle ear effusion. There is no impacted cerumen. Tympanic membrane is not erythematous.  ?   Left Ear: Tympanic membrane, ear canal and external ear normal. No drainage or tenderness.  No middle ear effusion. There is no impacted cerumen. Tympanic membrane is not erythematous.  ?   Nose: Congestion present. No rhinorrhea.  ?   Mouth/Throat:  ?   Mouth: Mucous membranes are  moist. No oral lesions.  ?   Pharynx: No pharyngeal swelling, oropharyngeal exudate, posterior oropharyngeal erythema or uvula swelling.  ?   Tonsils: No tonsillar exudate or tonsillar abscesses.  ?Eyes:  ?   General: No scleral icterus.    ?   Right eye: No discharge.     ?   Left eye: No discharge.  ?   Extraocular Movements: Extraocular movements intact.  ?   Right eye: Normal extraocular motion.  ?   Left eye: Normal extraocular motion.  ?   Conjunctiva/sclera: Conjunctivae normal.  ?Cardiovascular:  ?   Rate and Rhythm: Normal rate.  ?Pulmonary:  ?   Effort: Pulmonary effort is normal.  ?Musculoskeletal:  ?   Cervical back: Normal range of motion and neck supple.  ?Lymphadenopathy:  ?   Cervical: No cervical adenopathy.  ?Skin: ?   General: Skin is warm and dry.  ?Neurological:  ?   General: No focal deficit present.  ?   Mental Status: She is alert and oriented to person, place, and time.  ?Psychiatric:     ?   Mood and Affect: Mood normal.     ?   Behavior: Behavior normal.  ? ?Results for orders placed or performed during the hospital encounter of 03/14/22 (from the past 24 hour(s))  ?POCT rapid strep A     Status: None  ? Collection Time: 03/14/22  5:59 PM  ?Result Value Ref Range  ? Rapid Strep A Screen Negative Negative  ? ? ?Assessment and Plan :  ? ?PDMP not reviewed this encounter. ? ?1. Viral URI with cough   ?2. Throat pain   ?3. Moderate persistent asthma without complication   ? ?In the context of her asthma, recommended an oral prednisone course.  She declined a COVID test.  Did not want a throat culture either.  Recommended supportive care otherwise for viral respiratory illness. Counseled patient on potential for adverse effects with medications prescribed/recommended today, ER and return-to-clinic precautions discussed, patient verbalized understanding. ? ?  ?Wallis Bamberg, PA-C ?03/14/22 1818 ? ?

## 2022-03-14 NOTE — ED Triage Notes (Signed)
Pt reports sore throat, cough, runny nose, nasla congestion, chest congestion x 3 days. DayQuil, NyQuil, Sudafed, gives no relief.  ?

## 2023-05-18 ENCOUNTER — Encounter: Payer: Self-pay | Admitting: Dietician

## 2023-05-18 ENCOUNTER — Encounter: Payer: No Typology Code available for payment source | Attending: Internal Medicine | Admitting: Dietician

## 2023-05-18 VITALS — Ht 66.0 in

## 2023-05-18 DIAGNOSIS — E668 Other obesity: Secondary | ICD-10-CM | POA: Diagnosis present

## 2023-05-18 NOTE — Progress Notes (Signed)
Medical Nutrition Therapy  Appointment Start time:  8:12  Appointment End time:  9:16  Primary concerns today: overweight  Referral diagnosis: Obesity Preferred learning style: no preference indicated (auditory, visual, hands on, no preference indicated) Learning readiness: preparation (not ready, contemplating, ready, change in progress)  NUTRITION ASSESSMENT   Anthropometrics  Weight: pt declined Height: 66 in  Clinical Medical Hx: Asthma, reflux, celiac disease, hypercholesterolemia Medications: CALCIUM PO cholecalciferol (VITAMIN D) 1000 UNITS tablet diphenhydrAMINE (BENADRYL) 25 MG tablet EPINEPHrine 0.3 mg/0.3 mL IJ SOAJ injection famotidine (PEPCID) 20 MG tablet ferrous sulfate 325 (65 FE) MG tablet ibuprofen (ADVIL,MOTRIN) 200 MG tablet predniSONE (DELTASONE) 50 MG tablet promethazine-dextromethorphan (PROMETHAZINE-DM) 6.25-15 MG/5ML syrup Pseudoeph-Doxylamine-DM-APAP (DAYQUIL/NYQUIL COLD/FLU RELIEF PO) pseudoephedrine (SUDAFED) 60 MG tablet   Labs: HCT 36.6, Chol 259; Triglycerides 247; WBC 4.62 Notable Signs/Symptoms: none noted  Lifestyle & Dietary Hx  Pt states she used to work out 7 days a week, stating she fell out of love with. Pt stated she hurt her back in [place] and having issues today. Pt states she is working on getting back to the gym. Pt states she is germ-a-phobic. Pt states she is diagnosed with celiac disease, stating she eats bread, because it coats her stomach. Pt states she does not like gluten free bread. Pt states she has masses in her breasts. Pt states she has had iron infusions. Pt states she was taking B12, stating she stopped because it was high. Pt states she is on a 10 day detox, stating she is on day 4. Pt states this is anti inflammatory detox. Pt states her energy is back, stating it is hard to fall asleep. Pt states her daughter makes her lunch all week. Pt states she is living on fumes, stating she is taking care of two parents  who have dementia. Pt states she buys soda/drinks that she does not like, stating it prevents her from drinking them all the time.  Pt states she will eat a couple of bites sweets and trow away the rest. 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 Pt states she can not drink alcohol, stating it effects her pancreas. Pt states she is lactose intolerant, and can not ingest it. Pt states she does not eat beef or pork, stating she will eat beef when she becomes anemic and will crave it. Pt states she is veggie person, stating she loves them. Pt states she eats a can of sardines a day, stating at any point in the day. Pt states this substitutes for fish oil supplement. Pt states she is allergic to salt, stating she swells a lot with salt.  Estimated daily fluid intake: 100 oz Supplements: vit D, calcium, women's multivitamin Sleep: hard time falling asleep Stress / self-care: in the yard (country), phone games Current average weekly physical activity: starting to work out again, has equipment at home, planning to join [employer] up the road.  24-Hr Dietary Recall First Meal: oatmeal Snack: almonds (plain) Second Meal: chicken sandwich from home and almonds, blueberries or cantaloupe  Snack: almond or fruit Third Meal: chicken, cole slaw Snack: nothing after 8 pm right now Beverages: water, coffee  Estimated Energy Needs Calories: 1600  NUTRITION DIAGNOSIS  NB-1.1 Food and nutrition-related knowledge deficit As related to protein needs and possible insufficient protein intake.  As evidenced by patient diet recall.  NUTRITION INTERVENTION  Nutrition education (E-1) on the following topics:  Why you need complex carbohydrates: Whole grains and other complex carbohydrates are required to  have a healthy diet. Whole grains provide fiber which can help with blood glucose levels and help keep you satiated. Fruits and starchy vegetables provide essential vitamins and minerals  required for immune function, eyesight support, brain support, bone density, wound healing and many other functions within the body. According to the current evidenced based 2020-2025 Dietary Guidelines for Americans, complex carbohydrates are part of a healthy eating pattern which is associated with a decreased risk for type 2 diabetes, cancers, and cardiovascular disease.  Fruits & Vegetables: Aim to fill half your plate with a variety of fruits and vegetables. They are rich in vitamins, minerals, and fiber, and can help reduce the risk of chronic diseases. Choose a colorful assortment of fruits and vegetables to ensure you get a wide range of nutrients. Grains and Starches: Make at least half of your grain choices whole grains, such as Miara Emminger rice, whole wheat bread, and oats. Whole grains provide fiber, which aids in digestion and healthy cholesterol levels. Aim for whole forms of starchy vegetables such as potatoes, sweet potatoes, beans, peas, and corn, which are fiber rich and provide many vitamins and minerals.  Protein: Incorporate lean sources of protein, such as poultry, fish, beans, nuts, and seeds, into your meals. Protein is essential for building and repairing tissues, staying full, balancing blood sugar, as well as supporting immune function. Dairy: Include low-fat or fat-free dairy products like milk, yogurt, and cheese in your diet. Dairy foods are excellent sources of calcium and vitamin D, which are crucial for bone health.  Physical Activity: Aim for at least 150 minutes of physical activity per week. Regular physical activity promotes overall health-including helping to reduce risk for heart disease and diabetes, promoting mental health, and helping [place] sleep better.  Purpose of protein: Every cell in your body has protein. Protein is essential for the structure, function and regulation of tissues and organs within the body. Without protein enzymes and antibodies would not exist, and cells  would lack storage, transportation, and messenger systems. According to [facility]. [employer], the body is made up of at least 10000 different proteins. Lack of protein can lead to growth failure in children, loss of muscle mass, decreased immune system function, and overall weakening of various organs in the body.  SearchEngineCritic.nl, DoubleProperty.com.cy, PokerProtocol.pl  Handouts Provided Include  Health Benefits of Physical Activity Types of Fat (Saturated vs Unsaturated) Meal Ideas handout  Learning Style & Readiness for Change Teaching method utilized: Visual & Auditory  Demonstrated degree of understanding via: Teach Back  Barriers to learning/adherence to lifestyle change: sensitivity to many different foods  Goals Established by Pt Track protein; aim for 60 grams per day Increase physical activity; at least 150 minutes of moderate activity per week; aim for 3-4 days a week for one hour min. Identify cravings   MONITORING & EVALUATION Dietary intake, weekly physical activity.  Next Steps  Patient is to return in 1 month for follow-up.

## 2023-06-15 ENCOUNTER — Ambulatory Visit: Payer: No Typology Code available for payment source | Admitting: Dietician

## 2023-07-04 ENCOUNTER — Ambulatory Visit: Payer: No Typology Code available for payment source | Admitting: Dietician

## 2023-09-20 ENCOUNTER — Encounter: Payer: Self-pay | Admitting: Emergency Medicine

## 2023-09-20 ENCOUNTER — Other Ambulatory Visit: Payer: Self-pay

## 2023-09-20 ENCOUNTER — Ambulatory Visit
Admission: EM | Admit: 2023-09-20 | Discharge: 2023-09-20 | Disposition: A | Discharge status: Home / Self Care | Payer: No Typology Code available for payment source | Attending: Nurse Practitioner | Admitting: Nurse Practitioner

## 2023-09-20 DIAGNOSIS — Z20822 Contact with and (suspected) exposure to covid-19: Secondary | ICD-10-CM | POA: Diagnosis not present

## 2023-09-20 DIAGNOSIS — R0981 Nasal congestion: Secondary | ICD-10-CM | POA: Insufficient documentation

## 2023-09-20 DIAGNOSIS — Z1152 Encounter for screening for COVID-19: Secondary | ICD-10-CM | POA: Diagnosis present

## 2023-09-20 NOTE — ED Provider Notes (Signed)
[facility]    CSN: [id number] Arrival date & time: 09/20/23  1403      History   Chief Complaint Chief Complaint  Patient presents with   Covid Exposure    HPI Regina Lamb is a 57 y.o. female.   The history is provided by the patient.   Patient presents for request for COVID testing for close recent exposure.  Patient states that her sister was diagnosed with COVID earlier today.  Patient states that she has been experiencing post nasal drainage and runny nose for the past 2 days.  Patient reports that she does have a history of allergic rhinitis.  Patient denies fever, chills, headache, ear pain, sore throat, cough, chest pain, abdominal pain, nausea, vomiting, or diarrhea.  Patient reports she normally takes Sudafed for her allergy symptoms.  Past Medical History:  Diagnosis Date   Chronic fatigue    Fibromyalgia    Fibromyalgia    Vitamin D deficiency     Patient Active Problem List   Diagnosis Date Noted   Impaired glucose tolerance 11/21/2011   Back pain with radiation 11/21/2011   Headache 08/02/2011   UNSPECIFIED VITAMIN D DEFICIENCY 05/19/2010   DEPRESSION/ANXIETY 05/19/2010   FIBROMYALGIA 05/19/2010   INSOMNIA UNSPECIFIED 05/19/2010   FATIGUE 05/19/2010    Past Surgical History:  Procedure Laterality Date   ABDOMINAL HYSTERECTOMY     fibroid tumors removed  2010   laproscopy -endometriosis     TUBAL LIGATION  2002    OB History   No obstetric history on file.      Home Medications    Prior to Admission medications   Medication Sig Start Date End Date Taking? Authorizing Provider  CALCIUM PO Take 1 tablet by mouth daily.    [provider]  cholecalciferol (VITAMIN D) 1000 UNITS tablet Take 5,000 Units by mouth daily.    [provider]  diphenhydrAMINE (BENADRYL) 25 MG tablet Take 1 tablet (25 mg total) by mouth every 6 (six) hours as needed. 06/17/20   Harlene Salts A, PA-C  EPINEPHrine 0.3 mg/0.3 mL  IJ SOAJ injection Inject 0.3 mLs (0.3 mg total) into the muscle as needed for anaphylaxis. 06/17/20   Harlene Salts A, PA-C  famotidine (PEPCID) 20 MG tablet Take 1 tablet (20 mg total) by mouth 2 (two) times daily. 06/17/20   Harlene Salts A, PA-C  ferrous sulfate 325 (65 FE) MG tablet Take 325 mg by mouth daily with breakfast.    [provider]  ibuprofen (ADVIL,MOTRIN) 200 MG tablet Take 600-800 mg by mouth every 6 (six) hours as needed for headache.    [provider]  Omega-3 Fatty Acids (FISH OIL PO) Take 1 capsule by mouth daily.    [provider]  predniSONE (DELTASONE) 50 MG tablet Take 1 tablet (50 mg total) by mouth daily with breakfast. 03/14/22   Wallis Bamberg, PA-C  promethazine-dextromethorphan (PROMETHAZINE-DM) 6.25-15 MG/5ML syrup Take 5 mLs by mouth 4 (four) times daily as needed for cough. 03/14/22   Wallis Bamberg, PA-C  Pseudoeph-Doxylamine-DM-APAP (DAYQUIL/NYQUIL COLD/FLU RELIEF PO) Take by mouth.    [provider]  pseudoephedrine (SUDAFED) 60 MG tablet Take 60 mg by mouth every 4 (four) hours as needed for congestion.    [provider]    Family History Family History  Problem Relation Age of Onset   Diabetes Mother    Hypertension Mother    Hyperlipidemia Mother    Rheum arthritis Mother    Heart failure  Mother    Breast cancer Mother    Cancer Mother        breast   Dementia Father    Cataracts Father    Hyperlipidemia Father    Hypertension Father    Heart disease Brother    Cancer Maternal Grandmother        lung   Depression Sister     Social History Social History   Tobacco Use   Smoking status: Never   Smokeless tobacco: Never  Substance Use Topics   Alcohol use: No   Drug use: Never     Allergies   Penicillins, Penicillin g, Strawberry extract, Latex, Sulfonamide derivatives, and White petrolatum   Review of Systems Review of Systems Per HPI  Physical Exam Triage Vital Signs ED Triage  Vitals [09/20/23 1510]  Encounter Vitals Group     BP 127/76     Systolic BP Percentile      Diastolic BP Percentile      Pulse Rate 84     Resp 20     Temp 98.7 F (37.1 C)     Temp Source Oral     SpO2 96 %     Weight      Height      Head Circumference      Peak Flow      Pain Score 0     Pain Loc      Pain Education      Exclude from Growth Chart    No data found.  Updated Vital Signs BP 127/76 (BP Location: Right Arm)   Pulse 84   Temp 98.7 F (37.1 C) (Oral)   Resp 20   SpO2 96%   Visual Acuity Right Eye Distance:   Left Eye Distance:   Bilateral Distance:    Right Eye Near:   Left Eye Near:    Bilateral Near:     Physical Exam Vitals and nursing note reviewed.  Constitutional:      General: She is not in acute distress.    Appearance: Normal appearance.  HENT:     Head: Normocephalic.     Right Ear: Tympanic membrane, ear canal and external ear normal.     Left Ear: Tympanic membrane, ear canal and external ear normal.     Nose: Congestion present.     Mouth/Throat:     Mouth: Mucous membranes are moist.     Pharynx: Posterior oropharyngeal erythema present.     Comments: Cobblestoning present to posterior oropharynx Eyes:     Extraocular Movements: Extraocular movements intact.     Pupils: Pupils are equal, round, and reactive to light.  Cardiovascular:     Rate and Rhythm: Normal rate and regular rhythm.     Pulses: Normal pulses.     Heart sounds: Normal heart sounds.  Pulmonary:     Effort: Pulmonary effort is normal. No respiratory distress.     Breath sounds: Normal breath sounds. No stridor. No wheezing, rhonchi or rales.  Abdominal:     General: Bowel sounds are normal.     Palpations: Abdomen is soft.     Tenderness: There is no abdominal tenderness.  Musculoskeletal:     Cervical back: Normal range of motion.  Lymphadenopathy:     Cervical: No cervical adenopathy.  Skin:    General: Skin is warm and dry.  Neurological:      General: No focal deficit present.     Mental Status: She is alert and oriented to  person, place, and time.  Psychiatric:        Mood and Affect: Mood normal.        Behavior: Behavior normal.      UC Treatments / Results  Labs (all labs ordered are listed, but only abnormal results are displayed) Labs Reviewed  SARS CORONAVIRUS 2 (TAT 6-24 HRS)    EKG   Radiology No results found.  Procedures Procedures (including critical care time)  Medications Ordered in UC Medications - No data to display  Initial Impression / Assessment and Plan / UC Course  I have reviewed the triage vital signs and the nursing notes.  Pertinent labs & imaging results that were available during my care of the patient were reviewed by me and considered in my medical decision making (see chart for details).  Patient is well-appearing, she is in no acute distress, vital signs are stable.  COVID test is pending.  Patient is a candidate to receive Paxlovid if her COVID test is positive.  Supportive care recommendations were provided and discussed with patient to include continuing over-the-counter allergy medications, normal saline nasal spray, and over-the-counter analgesics for pain or discomfort.  Also discussed current COVID isolation recommendations if her COVID test is positive.  Patient is in agreement with this plan of care and verbalizes understanding.  All questions were answered.  Patient stable for discharge.  Work note was provided.  Final Clinical Impressions(s) / UC Diagnoses   Final diagnoses:  Nasal congestion  Close exposure to COVID-19 virus  Encounter for screening for COVID-19     Discharge Instructions      COVID test is pending.  You will be contacted if the pending test result is positive.  You also have access to result via MyChart. May take over-the-counter Tylenol or ibuprofen as needed for pain or discomfort. Continue your current allergy medication. As discussed, if  your COVID test is positive, you will need to remain home if you develop a fever.  You can return to your normal activities when she has been fever free for 24 hours with no medication.  If you have symptoms, you can return to your normal activities as long as you are wearing a mask.  If you continue to experience symptoms after completing the medication, continue to wear your mask for an additional 5 days. Follow-up in the emergency department immediately if you experience fevers, shortness of breath, difficulty breathing, or other concerns. Please notify your primary care physician of your recent positive COVID test. Follow-up as needed.      ED Prescriptions   None    PDMP not reviewed this encounter.   Abran Cantor, NP 09/20/23 [phone]

## 2023-09-20 NOTE — Discharge Instructions (Addendum)
COVID test is pending.  You will be contacted if the pending test result is positive.  You also have access to result via MyChart. May take over-the-counter Tylenol or ibuprofen as needed for pain or discomfort. Continue your current allergy medication. As discussed, if your COVID test is positive, you will need to remain home if you develop a fever.  You can return to your normal activities when she has been fever free for 24 hours with no medication.  If you have symptoms, you can return to your normal activities as long as you are wearing a mask.  If you continue to experience symptoms after completing the medication, continue to wear your mask for an additional 5 days. Follow-up in the emergency department immediately if you experience fevers, shortness of breath, difficulty breathing, or other concerns. Please notify your primary care physician of your recent positive COVID test. Follow-up as needed.

## 2023-09-20 NOTE — ED Triage Notes (Signed)
Pt reports runny nose, nasal drainage x2 days. Pt also reports close familial exposure to covid.

## 2023-09-21 LAB — SARS CORONAVIRUS 2 (TAT 6-24 HRS): SARS Coronavirus 2: NEGATIVE

## 2024-03-09 ENCOUNTER — Ambulatory Visit: Admission: EM | Admit: 2024-03-09 | Discharge: 2024-03-09 | Disposition: A | Discharge status: Home / Self Care

## 2024-03-09 DIAGNOSIS — J069 Acute upper respiratory infection, unspecified: Secondary | ICD-10-CM | POA: Diagnosis not present

## 2024-03-09 LAB — POC COVID19/FLU A&B COMBO
Covid Antigen, POC: NEGATIVE
Influenza A Antigen, POC: NEGATIVE
Influenza B Antigen, POC: NEGATIVE

## 2024-03-09 MED ORDER — FLUTICASONE PROPIONATE 50 MCG/ACT NA SUSP: 1.0000 | Freq: Every day | NASAL | 2 refills | Status: AC

## 2024-03-09 MED ORDER — BENZONATATE 100 MG PO CAPS: 100.0000 mg | ORAL_CAPSULE | Freq: Three times a day (TID) | ORAL | 0 refills | Status: AC

## 2024-03-09 NOTE — ED Triage Notes (Signed)
 Pt reports she has throat drainage, fever, runny nose, and chest congestion x 3 days

## 2024-03-09 NOTE — ED Provider Notes (Signed)
 [facility]    CSN: [id number] Arrival date & time: 03/09/24  0906      History   Chief Complaint No chief complaint on file.   HPI Regina Lamb is a 58 y.o. female.   Patient complains of cough, congestion that started about 3 days ago.  She denies fever but does report feeling chills.  Denies shortness of breath or wheezing.  Patient reports she does have history of asthma and has used her albuterol inhaler over the last few days.  She has been taking over-the-counter medications including allergy medication with minimal relief.  Denies body aches.    Past Medical History:  Diagnosis Date   Breast cancer (HCC) 2017   Chronic fatigue    Fibromyalgia    Fibromyalgia    Vitamin D deficiency     Patient Active Problem List   Diagnosis Date Noted   Impaired glucose tolerance 11/21/2011   Back pain with radiation 11/21/2011   Headache 08/02/2011   UNSPECIFIED VITAMIN D DEFICIENCY 05/19/2010   DEPRESSION/ANXIETY 05/19/2010   FIBROMYALGIA 05/19/2010   INSOMNIA UNSPECIFIED 05/19/2010   FATIGUE 05/19/2010    Past Surgical History:  Procedure Laterality Date   ABDOMINAL HYSTERECTOMY     fibroid tumors removed  2010   laproscopy -endometriosis     TUBAL LIGATION  2002    OB History   No obstetric history on file.      Home Medications    Prior to Admission medications   Medication Sig Start Date End Date Taking? Authorizing Provider  albuterol (VENTOLIN HFA) 108 (90 Base) MCG/ACT inhaler Inhale into the lungs. 09/22/17  Yes [provider]  anastrozole (ARIMIDEX) 1 MG tablet Take by mouth. 08/18/20  Yes [provider]  benzonatate (TESSALON) 100 MG capsule Take 1 capsule (100 mg total) by mouth every 8 (eight) hours. 03/09/24  Yes Ward, Tylene Fantasia, PA-C  budesonide-formoterol (SYMBICORT) 160-4.5 MCG/ACT inhaler Inhale into the lungs. 09/22/17  Yes [provider]  calcium carbonate (OS-CAL) 1250 (500 Ca) MG chewable  tablet Chew 1 tablet by mouth daily. 09/22/17  Yes [provider]  cetirizine (ZYRTEC) 10 MG tablet Take by mouth. 09/22/17  Yes [provider]  Cyanocobalamin 1000 MCG TBCR Take 1 tablet by mouth daily. 09/22/17  Yes [provider]  fluticasone (FLONASE) 50 MCG/ACT nasal spray Place 1 spray into both nostrils daily. 03/09/24  Yes Ward, Tylene Fantasia, PA-C  gabapentin (NEURONTIN) 300 MG capsule Take by mouth. 09/22/17  Yes [provider]  omeprazole (PRILOSEC) 20 MG capsule Take by mouth. 09/22/17  Yes [provider]  propranolol (INDERAL) 20 MG tablet TAKE ONE TABLET BY MOUTH AT BEDTIME FOR 1 WEEK, THEN TAKE ONE TABLET IN THE MORNING AND EVENING FOR MIGRAINE PREVENTION 09/14/20  Yes [provider]  SUMAtriptan (IMITREX) 100 MG tablet TAKE ONE TABLET BY MOUTH AS DIRECTED AS NEEDED (FIRST DOSE AT ONSET OF HEADACHE,THEN REPEAT AFTER 2 HOURS IF NO RELIEF-NO MORE THAN 200 MG PER DAY) 09/14/20  Yes [provider]  zaleplon (SONATA) 5 MG capsule TAKE ONE CAPSULE BY MOUTH AT BEDTIME FOR SLEEP. *APPROVED* 01/11/21  Yes [provider]  CALCIUM PO Take 1 tablet by mouth daily.    [provider]  cholecalciferol (VITAMIN D) 1000 UNITS tablet Take 5,000 Units by mouth daily.    [provider]  diphenhydrAMINE (BENADRYL) 25 MG tablet Take 1 tablet (25 mg total) by mouth every 6 (six) hours as needed. 06/17/20  Harlene Salts A, PA-C  EPINEPHrine 0.3 mg/0.3 mL IJ SOAJ injection Inject 0.3 mLs (0.3 mg total) into the muscle as needed for anaphylaxis. 06/17/20   Harlene Salts A, PA-C  famotidine (PEPCID) 20 MG tablet Take 1 tablet (20 mg total) by mouth 2 (two) times daily. 06/17/20   Harlene Salts A, PA-C  ferrous sulfate 325 (65 FE) MG tablet Take 325 mg by mouth daily with breakfast.    [provider]  ibuprofen (ADVIL,MOTRIN) 200 MG tablet Take 600-800 mg by mouth every 6 (six) hours as needed for headache.     [provider]  Omega-3 Fatty Acids (FISH OIL PO) Take 1 capsule by mouth daily.    [provider]  predniSONE (DELTASONE) 50 MG tablet Take 1 tablet (50 mg total) by mouth daily with breakfast. 03/14/22   Wallis Bamberg, PA-C  promethazine-dextromethorphan (PROMETHAZINE-DM) 6.25-15 MG/5ML syrup Take 5 mLs by mouth 4 (four) times daily as needed for cough. 03/14/22   Wallis Bamberg, PA-C  Pseudoeph-Doxylamine-DM-APAP (DAYQUIL/NYQUIL COLD/FLU RELIEF PO) Take by mouth.    [provider]  pseudoephedrine (SUDAFED) 60 MG tablet Take 60 mg by mouth every 4 (four) hours as needed for congestion.    [provider]    Family History Family History  Problem Relation Age of Onset   Diabetes Mother    Hypertension Mother    Hyperlipidemia Mother    Rheum arthritis Mother    Heart failure Mother    Breast cancer Mother    Cancer Mother        breast   Dementia Father    Cataracts Father    Hyperlipidemia Father    Hypertension Father    Heart disease Brother    Cancer Maternal Grandmother        lung   Depression Sister     Social History Social History   Tobacco Use   Smoking status: Never   Smokeless tobacco: Never  Substance Use Topics   Alcohol use: No   Drug use: Never     Allergies   Penicillins, Penicillin g, Strawberry extract, Latex, Sulfonamide derivatives, and White petrolatum   Review of Systems Review of Systems  Constitutional:  Negative for chills and fever.  HENT:  Positive for congestion. Negative for ear pain and sore throat.   Eyes:  Negative for pain and visual disturbance.  Respiratory:  Positive for cough. Negative for shortness of breath.   Cardiovascular:  Negative for chest pain and palpitations.  Gastrointestinal:  Negative for abdominal pain and vomiting.  Genitourinary:  Negative for dysuria and hematuria.  Musculoskeletal:  Negative for arthralgias and back pain.  Skin:  Negative for color change and rash.   Neurological:  Negative for seizures and syncope.  All other systems reviewed and are negative.    Physical Exam Triage Vital Signs ED Triage Vitals  Encounter Vitals Group     BP 03/09/24 0919 132/65     Systolic BP Percentile --      Diastolic BP Percentile --      Pulse Rate 03/09/24 0919 93     Resp 03/09/24 0919 20     Temp 03/09/24 0919 98.2 F (36.8 C)     Temp Source 03/09/24 0915 Oral     SpO2 03/09/24 0919 95 %     Weight --      Height --      Head Circumference --      Peak Flow --  Pain Score 03/09/24 0915 4     Pain Loc --      Pain Education --      Exclude from Growth Chart --    No data found.  Updated Vital Signs BP 132/65 (BP Location: Right Arm)   Pulse 93   Temp 98.2 F (36.8 C) (Oral)   Resp 20   SpO2 95%   Visual Acuity Right Eye Distance:   Left Eye Distance:   Bilateral Distance:    Right Eye Near:   Left Eye Near:    Bilateral Near:     Physical Exam Vitals and nursing note reviewed.  Constitutional:      General: She is not in acute distress.    Appearance: She is well-developed.  HENT:     Head: Normocephalic and atraumatic.  Eyes:     Conjunctiva/sclera: Conjunctivae normal.  Cardiovascular:     Rate and Rhythm: Normal rate and regular rhythm.     Heart sounds: No murmur heard. Pulmonary:     Effort: Pulmonary effort is normal. No respiratory distress.     Breath sounds: Normal breath sounds.  Abdominal:     Palpations: Abdomen is soft.     Tenderness: There is no abdominal tenderness.  Musculoskeletal:        General: No swelling.     Cervical back: Neck supple.  Skin:    General: Skin is warm and dry.     Capillary Refill: Capillary refill takes less than 2 seconds.  Neurological:     Mental Status: She is alert.  Psychiatric:        Mood and Affect: Mood normal.      UC Treatments / Results  Labs (all labs ordered are listed, but only abnormal results are displayed) Labs Reviewed  POC COVID19/FLU  A&B COMBO - Normal    EKG   Radiology No results found.  Procedures Procedures (including critical care time)  Medications Ordered in UC Medications - No data to display  Initial Impression / Assessment and Plan / UC Course  I have reviewed the triage vital signs and the nursing notes.  Pertinent labs & imaging results that were available during my care of the patient were reviewed by me and considered in my medical decision making (see chart for details).     URI, COVID/flu negative.  Patient well-appearing in no acute distress, lungs clear to auscultation, vitals within normal limits.  Supportive care discussed.  Tessalon and Flonase prescribed.  Return precautions discussed. Final Clinical Impressions(s) / UC Diagnoses   Final diagnoses:  Acute upper respiratory infection     Discharge Instructions      Recommend Flonase and Mucinex for congestion and drainage. Can use Tessalon as needed for cough. Recommend ibuprofen or Tylenol as needed. Drink plenty of fluids and rest. Can use albuterol inhaler as needed if you are experiencing wheezing or shortness of breath. Symptoms typically last 5 to 7 days.  If no improvement or symptoms become worse may return for evaluation   ED Prescriptions     Medication Sig Dispense Auth. Provider   benzonatate (TESSALON) 100 MG capsule Take 1 capsule (100 mg total) by mouth every 8 (eight) hours. 21 capsule Ward, Shanda Bumps Z, PA-C   fluticasone (FLONASE) 50 MCG/ACT nasal spray Place 1 spray into both nostrils daily. 9.9 mL Ward, Tylene Fantasia, PA-C      PDMP not reviewed this encounter.   Ward, Tylene Fantasia, PA-C 03/09/24 1007

## 2024-03-09 NOTE — Discharge Instructions (Signed)
 Recommend Flonase and Mucinex for congestion and drainage. Can use Tessalon as needed for cough. Recommend ibuprofen or Tylenol as needed. Drink plenty of fluids and rest. Can use albuterol inhaler as needed if you are experiencing wheezing or shortness of breath. Symptoms typically last 5 to 7 days.  If no improvement or symptoms become worse may return for evaluation

## 2024-04-16 ENCOUNTER — Ambulatory Visit
Admission: EM | Admit: 2024-04-16 | Discharge: 2024-04-16 | Disposition: A | Discharge status: Home / Self Care | Attending: Nurse Practitioner | Admitting: Nurse Practitioner

## 2024-04-16 ENCOUNTER — Encounter: Payer: Self-pay | Admitting: Emergency Medicine

## 2024-04-16 ENCOUNTER — Other Ambulatory Visit: Payer: Self-pay

## 2024-04-16 DIAGNOSIS — H60501 Unspecified acute noninfective otitis externa, right ear: Secondary | ICD-10-CM

## 2024-04-16 MED ORDER — OFLOXACIN 0.3 % OT SOLN: 10.0000 [drp] | Freq: Every day | OTIC | 0 refills | Status: AC

## 2024-04-16 NOTE — Discharge Instructions (Addendum)
 You have an infection in your outer right ear.  Use the ear drops as prescribed to clear up the infection.  Avoid getting any water in your ear until this heals.  Follow up with PCP if pain does not improve with treatment.

## 2024-04-16 NOTE — ED Provider Notes (Signed)
 [facility]    CSN: [id number] Arrival date & time: 04/16/24  1415      History   Chief Complaint Chief Complaint  Patient presents with   Ear Pain    HPI Regina Lamb is a 58 y.o. female.   Patient presents today with right ear pain, right-sided facial pain, and pressure behind her right eye ongoing for the past several days.  She denies fever, cough, congestion recently.  She does report she recently had influenza last month and only recently recovered.  No known injury to the ear.  She has been applying a cotton ball without improvement.    Past Medical History:  Diagnosis Date   Breast cancer (HCC) 2017   Chronic fatigue    Fibromyalgia    Fibromyalgia    Vitamin D  deficiency     Patient Active Problem List   Diagnosis Date Noted   Impaired glucose tolerance 11/21/2011   Back pain with radiation 11/21/2011   Headache 08/02/2011   UNSPECIFIED VITAMIN D  DEFICIENCY 05/19/2010   DEPRESSION/ANXIETY 05/19/2010   FIBROMYALGIA 05/19/2010   INSOMNIA UNSPECIFIED 05/19/2010   FATIGUE 05/19/2010    Past Surgical History:  Procedure Laterality Date   ABDOMINAL HYSTERECTOMY     fibroid tumors removed  2010   laproscopy -endometriosis     TUBAL LIGATION  2002    OB History   No obstetric history on file.      Home Medications    Prior to Admission medications   Medication Sig Start Date End Date Taking? Authorizing Provider  ofloxacin (FLOXIN) 0.3 % OTIC solution Place 10 drops into the right ear daily for 5 days. 04/16/24 04/21/24 Yes Wilhemena Harbour, NP  albuterol (VENTOLIN HFA) 108 (90 Base) MCG/ACT inhaler Inhale into the lungs. 09/22/17   [provider]  anastrozole (ARIMIDEX) 1 MG tablet Take by mouth. 08/18/20   [provider]  benzonatate  (TESSALON ) 100 MG capsule Take 1 capsule (100 mg total) by mouth every 8 (eight) hours. 03/09/24   Ward, Char Common, PA-C  budesonide-formoterol (SYMBICORT) 160-4.5 MCG/ACT inhaler  Inhale into the lungs. 09/22/17   [provider]  calcium carbonate (OS-CAL) 1250 (500 Ca) MG chewable tablet Chew 1 tablet by mouth daily. 09/22/17   [provider]  CALCIUM PO Take 1 tablet by mouth daily.    [provider]  cetirizine (ZYRTEC) 10 MG tablet Take by mouth. 09/22/17   [provider]  cholecalciferol (VITAMIN D ) 1000 UNITS tablet Take 5,000 Units by mouth daily.    [provider]  Cyanocobalamin 1000 MCG TBCR Take 1 tablet by mouth daily. 09/22/17   [provider]  diphenhydrAMINE  (BENADRYL ) 25 MG tablet Take 1 tablet (25 mg total) by mouth every 6 (six) hours as needed. 06/17/20   Hadassah Letters A, PA-C  EPINEPHrine  0.3 mg/0.3 mL IJ SOAJ injection Inject 0.3 mLs (0.3 mg total) into the muscle as needed for anaphylaxis. 06/17/20   Hadassah Letters A, PA-C  famotidine  (PEPCID ) 20 MG tablet Take 1 tablet (20 mg total) by mouth 2 (two) times daily. 06/17/20   Hadassah Letters A, PA-C  ferrous sulfate 325 (65 FE) MG tablet Take 325 mg by mouth daily with breakfast.    [provider]  fluticasone  (FLONASE ) 50 MCG/ACT nasal spray Place 1 spray into both nostrils daily. 03/09/24   Ward, Char Common, PA-C  gabapentin (NEURONTIN) 300 MG capsule Take by mouth. 09/22/17   [provider]  ibuprofen  (ADVIL ,MOTRIN ) 200 MG  tablet Take 600-800 mg by mouth every 6 (six) hours as needed for headache.    [provider]  Omega-3 Fatty Acids (FISH OIL PO) Take 1 capsule by mouth daily.    [provider]  omeprazole (PRILOSEC) 20 MG capsule Take by mouth. 09/22/17   [provider]  predniSONE  (DELTASONE ) 50 MG tablet Take 1 tablet (50 mg total) by mouth daily with breakfast. 03/14/22   Adolph Hoop, PA-C  promethazine -dextromethorphan (PROMETHAZINE -DM) 6.25-15 MG/5ML syrup Take 5 mLs by mouth 4 (four) times daily as needed for cough. 03/14/22   Adolph Hoop, PA-C  propranolol (INDERAL) 20 MG tablet TAKE ONE  TABLET BY MOUTH AT BEDTIME FOR 1 WEEK, THEN TAKE ONE TABLET IN THE MORNING AND EVENING FOR MIGRAINE PREVENTION 09/14/20   [provider]  Pseudoeph-Doxylamine-DM-APAP (DAYQUIL/NYQUIL COLD/FLU RELIEF PO) Take by mouth.    [provider]  pseudoephedrine (SUDAFED) 60 MG tablet Take 60 mg by mouth every 4 (four) hours as needed for congestion.    [provider]  SUMAtriptan (IMITREX) 100 MG tablet TAKE ONE TABLET BY MOUTH AS DIRECTED AS NEEDED (FIRST DOSE AT ONSET OF HEADACHE,THEN REPEAT AFTER 2 HOURS IF NO RELIEF-NO MORE THAN 200 MG PER DAY) 09/14/20   [provider]  zaleplon (SONATA) 5 MG capsule TAKE ONE CAPSULE BY MOUTH AT BEDTIME FOR SLEEP. *APPROVED* 01/11/21   [provider]    Family History Family History  Problem Relation Age of Onset   Diabetes Mother    Hypertension Mother    Hyperlipidemia Mother    Rheum arthritis Mother    Heart failure Mother    Breast cancer Mother    Cancer Mother        breast   Dementia Father    Cataracts Father    Hyperlipidemia Father    Hypertension Father    Heart disease Brother    Cancer Maternal Grandmother        lung   Depression Sister     Social History Social History   Tobacco Use   Smoking status: Never   Smokeless tobacco: Never  Substance Use Topics   Alcohol use: No   Drug use: Never     Allergies   Penicillins, Penicillin g, Strawberry extract, Latex, Sulfonamide derivatives, and White petrolatum   Review of Systems Review of Systems Per HPI  Physical Exam Triage Vital Signs ED Triage Vitals  Encounter Vitals Group     BP 04/16/24 1502 118/83     Systolic BP Percentile --      Diastolic BP Percentile --      Pulse Rate 04/16/24 1502 97     Resp 04/16/24 1502 20     Temp 04/16/24 1502 98.3 F (36.8 C)     Temp Source 04/16/24 1502 Oral     SpO2 04/16/24 1502 93 %     Weight --      Height --      Head Circumference --      Peak Flow --      Pain Score  04/16/24 1500 7     Pain Loc --      Pain Education --      Exclude from Growth Chart --    No data found.  Updated Vital Signs BP 118/83 (BP Location: Right Arm)   Pulse 97   Temp 98.3 F (36.8 C) (Oral)   Resp 20   SpO2 93%   Visual Acuity Right Eye Distance:   Left  Eye Distance:   Bilateral Distance:    Right Eye Near:   Left Eye Near:    Bilateral Near:     Physical Exam Vitals and nursing note reviewed.  Constitutional:      General: She is not in acute distress.    Appearance: Normal appearance. She is not toxic-appearing.  HENT:     Head: Normocephalic and atraumatic.     Right Ear: Tympanic membrane normal. No decreased hearing noted. Swelling and tenderness present. No middle ear effusion. No mastoid tenderness. No PE tube. Tympanic membrane is not injected, erythematous or bulging.     Left Ear: Tympanic membrane, ear canal and external ear normal.  No middle ear effusion. There is no impacted cerumen.     Ears:     Comments: Patient has preauricular and postauricular pain to palpation, however no lymphadenopathy appreciated.    Nose: Nose normal. No congestion or rhinorrhea.     Mouth/Throat:     Mouth: Mucous membranes are moist.     Pharynx: Oropharynx is clear.  Cardiovascular:     Rate and Rhythm: Normal rate.  Pulmonary:     Effort: Pulmonary effort is normal. No respiratory distress.  Musculoskeletal:     Cervical back: Normal range of motion.  Lymphadenopathy:     Cervical: No cervical adenopathy.  Skin:    General: Skin is warm and dry.     Coloration: Skin is not jaundiced or pale.     Findings: No erythema.  Neurological:     Mental Status: She is alert and oriented to person, place, and time.  Psychiatric:        Behavior: Behavior is cooperative.      UC Treatments / Results  Labs (all labs ordered are listed, but only abnormal results are displayed) Labs Reviewed - No data to display  EKG   Radiology No results  found.  Procedures Procedures (including critical care time)  Medications Ordered in UC Medications - No data to display  Initial Impression / Assessment and Plan / UC Course  I have reviewed the triage vital signs and the nursing notes.  Pertinent labs & imaging results that were available during my care of the patient were reviewed by me and considered in my medical decision making (see chart for details).   Patient is well-appearing, normotensive, afebrile, not tachycardic, not tachypneic, oxygenating well on room air.    1. Acute otitis externa of right ear, unspecified type Treat for otitis externa with ofloxacin drops daily for 5 days Ear precautions discussed Return precautions also discussed  The patient was given the opportunity to ask questions.  All questions answered to their satisfaction.  The patient is in agreement to this plan.   Final Clinical Impressions(s) / UC Diagnoses   Final diagnoses:  Acute otitis externa of right ear, unspecified type     Discharge Instructions      You have an infection in your outer right ear.  Use the ear drops as prescribed to clear up the infection.  Avoid getting any water in your ear until this heals.  Follow up with PCP if pain does not improve with treatment.    ED Prescriptions     Medication Sig Dispense Auth. Provider   ofloxacin (FLOXIN) 0.3 % OTIC solution Place 10 drops into the right ear daily for 5 days. 5 mL Wilhemena Harbour, NP      PDMP not reviewed this encounter.   Wilhemena Harbour, NP 04/16/24 [phone]

## 2024-04-16 NOTE — ED Triage Notes (Signed)
 Pt reports right ear pain and right sided facial pain and eye pain for last several days. Denies any known injury.
